# Patient Record
Sex: Female | Born: 1975
Health system: Southern US, Community
[De-identification: ages and names within clinical notes are randomized; demographics above are authoritative.]

## PROBLEM LIST (undated history)

## (undated) DIAGNOSIS — E669 Obesity, unspecified: Secondary | ICD-10-CM

## (undated) DIAGNOSIS — R12 Heartburn: Secondary | ICD-10-CM

## (undated) HISTORY — DX: Obesity, unspecified: E66.9

## (undated) HISTORY — DX: Heartburn: R12

---

## 2009-01-17 ENCOUNTER — Inpatient Hospital Stay: Payer: Self-pay | Admitting: Internal Medicine

## 2009-04-29 ENCOUNTER — Ambulatory Visit: Payer: Self-pay | Admitting: Cardiology

## 2009-05-10 ENCOUNTER — Emergency Department: Payer: Self-pay | Admitting: Emergency Medicine

## 2009-05-12 ENCOUNTER — Emergency Department: Payer: Self-pay | Admitting: Emergency Medicine

## 2009-05-13 ENCOUNTER — Emergency Department: Payer: Self-pay | Admitting: Emergency Medicine

## 2009-05-14 ENCOUNTER — Emergency Department: Payer: Self-pay | Admitting: Emergency Medicine

## 2014-06-10 DIAGNOSIS — R12 Heartburn: Secondary | ICD-10-CM

## 2014-06-10 DIAGNOSIS — E669 Obesity, unspecified: Secondary | ICD-10-CM | POA: Insufficient documentation

## 2014-07-18 ENCOUNTER — Ambulatory Visit (INDEPENDENT_AMBULATORY_CARE_PROVIDER_SITE_OTHER): Payer: BLUE CROSS/BLUE SHIELD

## 2014-07-18 VITALS — BP 136/81 | HR 89 | Ht 71.0 in | Wt 178.4 lb

## 2014-07-18 DIAGNOSIS — E669 Obesity, unspecified: Secondary | ICD-10-CM | POA: Diagnosis not present

## 2014-07-18 MED ORDER — PHENTERMINE HCL 37.5 MG PO CAPS: 37.5000 mg | ORAL_CAPSULE | ORAL | Status: DC

## 2014-07-18 NOTE — Progress Notes (Signed)
Patient ID: Brenda Walsh, female   DOB: 05/18/1975, 39 y.o.   MRN: [id number] Who presents for weight, B/P ck., and B12 injection. Needs a refill on Phentermine. B12 not needed at this time.  Pt. States she has no side effects at this time. Would like to wait on B12 injection.  Has an appt. In July for annual with MNB.

## 2014-08-20 ENCOUNTER — Ambulatory Visit (INDEPENDENT_AMBULATORY_CARE_PROVIDER_SITE_OTHER): Payer: BLUE CROSS/BLUE SHIELD | Admitting: Obstetrics and Gynecology

## 2014-08-20 ENCOUNTER — Encounter: Payer: Self-pay | Admitting: Obstetrics and Gynecology

## 2014-08-20 ENCOUNTER — Other Ambulatory Visit: Payer: Self-pay | Admitting: Obstetrics and Gynecology

## 2014-08-20 ENCOUNTER — Other Ambulatory Visit: Payer: Self-pay | Admitting: *Deleted

## 2014-08-20 VITALS — BP 121/90 | HR 84 | Ht 70.0 in | Wt 178.7 lb

## 2014-08-20 DIAGNOSIS — Z01419 Encounter for gynecological examination (general) (routine) without abnormal findings: Secondary | ICD-10-CM | POA: Diagnosis not present

## 2014-08-20 MED ORDER — ETONOGESTREL-ETHINYL ESTRADIOL 0.12-0.015 MG/24HR VA RING: 1.0000 | VAGINAL_RING | VAGINAL | Status: DC

## 2014-08-20 NOTE — Progress Notes (Signed)
  Subjective:     Brenda Walsh is a 39 y.o. female and is here for a comprehensive physical exam. The patient reports no problems.  History   Social History  . Marital Status: Married    Spouse Name: N/A  . Number of Children: N/A  . Years of Education: N/A   Occupational History  . Not on file.   Social History Main Topics  . Smoking status: Never Smoker   . Smokeless tobacco: Never Used  . Alcohol Use: Yes     Comment: OCCASIONAL  . Drug Use: No  . Sexual Activity: Yes    Birth Control/ Protection: Inserts   Other Topics Concern  . Not on file   Social History Narrative   Health Maintenance  Topic Date Due  . HIV Screening  03/25/1989  . PAP SMEAR  03/25/1992  . TETANUS/TDAP  03/25/1993  . INFLUENZA VACCINE  09/15/2014    The following portions of the patient's history were reviewed and updated as appropriate: allergies, current medications, past family history, past medical history, past social history, past surgical history and problem list.  Review of Systems A comprehensive review of systems was negative.   Objective:    General appearance: alert, cooperative and appears stated age Neck: no adenopathy, no carotid bruit, no JVD, supple, symmetrical, trachea midline and thyroid not enlarged, symmetric, no tenderness/mass/nodules Lungs: clear to auscultation bilaterally Breasts: normal appearance, no masses or tenderness Heart: regular rate and rhythm, S1, S2 normal, no murmur, click, rub or gallop Abdomen: soft, non-tender; bowel sounds normal; no masses,  no organomegaly Pelvic: cervix normal in appearance, external genitalia normal, no adnexal masses or tenderness, no cervical motion tenderness, rectovaginal septum normal, uterus normal size, shape, and consistency and vagina normal without discharge Extremities: extremities normal, atraumatic, no cyanosis or edema    Assessment:    Healthy female exam. HC user, overweight      Plan:  Routine screening  labs and refills on all meeds; MMG ordered;  RTC 1 year for AE and in 4 weeks for weight management   See After Visit Summary for Counseling Recommendations  Loralie Malta Ines BloomerBurr, CNM

## 2014-08-20 NOTE — Patient Instructions (Signed)

## 2014-08-21 LAB — COMPREHENSIVE METABOLIC PANEL
ALBUMIN: 4 g/dL (ref 3.5–5.5)
ALT: 12 IU/L (ref 0–32)
AST: 16 IU/L (ref 0–40)
Albumin/Globulin Ratio: 1.4 (ref 1.1–2.5)
Alkaline Phosphatase: 56 IU/L (ref 39–117)
BUN/Creatinine Ratio: 14 (ref 9–23)
BUN: 11 mg/dL (ref 6–24)
Bilirubin Total: 0.2 mg/dL (ref 0.0–1.2)
CALCIUM: 9.5 mg/dL (ref 8.7–10.2)
CO2: 21 mmol/L (ref 18–29)
Chloride: 100 mmol/L (ref 97–108)
Creatinine, Ser: 0.81 mg/dL (ref 0.57–1.00)
GFR calc Af Amer: 105 mL/min/{1.73_m2} (ref >59)
GFR calc non Af Amer: 91 mL/min/{1.73_m2} (ref >59)
GLOBULIN, TOTAL: 2.9 g/dL (ref 1.5–4.5)
GLUCOSE: 92 mg/dL (ref 65–99)
POTASSIUM: 4.4 mmol/L (ref 3.5–5.2)
Sodium: 138 mmol/L (ref 134–144)
Total Protein: 6.9 g/dL (ref 6.0–8.5)

## 2014-08-21 LAB — LIPID PANEL
Chol/HDL Ratio: 2.5 ratio units (ref 0.0–4.4)
Cholesterol, Total: 222 mg/dL — ABNORMAL HIGH (ref 100–199)
HDL: 90 mg/dL (ref >39)
LDL Calculated: 113 mg/dL — ABNORMAL HIGH (ref 0–99)
TRIGLYCERIDES: 95 mg/dL (ref 0–149)
VLDL Cholesterol Cal: 19 mg/dL (ref 5–40)

## 2014-08-21 LAB — TSH: TSH: 4.2 u[IU]/mL (ref 0.450–4.500)

## 2014-08-21 LAB — VITAMIN D 25 HYDROXY (VIT D DEFICIENCY, FRACTURES): Vit D, 25-Hydroxy: 41.2 ng/mL (ref 30.0–100.0)

## 2014-08-24 LAB — PAP IG, CT-NG, RFX HPV ASCU: PAP Smear Comment: 0

## 2014-08-26 ENCOUNTER — Encounter: Payer: Self-pay | Admitting: *Deleted

## 2014-08-27 ENCOUNTER — Ambulatory Visit
Admission: RE | Admit: 2014-08-27 | Discharge: 2014-08-27 | Disposition: A | Discharge status: Home / Self Care | Payer: BLUE CROSS/BLUE SHIELD | Source: Ambulatory Visit | Attending: Obstetrics and Gynecology | Admitting: Obstetrics and Gynecology

## 2014-08-27 ENCOUNTER — Encounter: Payer: Self-pay | Admitting: *Deleted

## 2014-08-27 DIAGNOSIS — Z1231 Encounter for screening mammogram for malignant neoplasm of breast: Secondary | ICD-10-CM | POA: Insufficient documentation

## 2014-08-27 DIAGNOSIS — Z01419 Encounter for gynecological examination (general) (routine) without abnormal findings: Secondary | ICD-10-CM | POA: Diagnosis present

## 2014-11-03 ENCOUNTER — Encounter: Payer: Self-pay | Admitting: Obstetrics and Gynecology

## 2014-11-03 ENCOUNTER — Ambulatory Visit (INDEPENDENT_AMBULATORY_CARE_PROVIDER_SITE_OTHER): Payer: BLUE CROSS/BLUE SHIELD | Admitting: Obstetrics and Gynecology

## 2014-11-03 VITALS — BP 126/84 | HR 87 | Ht 70.0 in | Wt 180.4 lb

## 2014-11-03 DIAGNOSIS — E669 Obesity, unspecified: Secondary | ICD-10-CM | POA: Diagnosis not present

## 2014-11-03 MED ORDER — CLOBETASOL PROPIONATE 0.05 % EX OINT: 1.0000 "application " | TOPICAL_OINTMENT | Freq: Two times a day (BID) | CUTANEOUS | Status: DC

## 2014-11-03 MED ORDER — PHENTERMINE HCL 37.5 MG PO TABS: 37.5000 mg | ORAL_TABLET | Freq: Every day | ORAL | Status: DC

## 2014-11-03 MED ORDER — CYANOCOBALAMIN 1000 MCG/ML IJ SOLN: 1000.0000 ug | Freq: Once | INTRAMUSCULAR | Status: DC

## 2014-11-03 NOTE — Progress Notes (Signed)
Subjective:     Patient ID: Brenda Walsh, female   DOB: [birth year], 39 y.o.   MRN: [id number]  HPI Here for medication refill for weight management- has been off medication x 1 month, exercising 3 days a week for an hour and drinks a lot of water, feels like lack of weight loss may be more related too calorie intake and wine(weekends)  Also reports left vulvar itching intermittently x 3 months, worse at night  Review of Systems Vulvar itching    Objective:   Physical Exam A&O x4 Well groomed female in no distress Pelvic exam: VULVA: normal appearing vulva with no masses, tenderness or lesions, vulvar lesion on inner left minora at introitus c/w lichens-white plaque with inflamation around the area.    Assessment:     Overweight- on plan here- needs refill Lichens of left vulva     Plan:     Restart weight loss medications, B12 injection given today, will return in 5 weeks for weight check and next B12 injection. To increase exercise to 4-5 days a week and limit alcohol and carb intake  Counseled on Lichens and treatment- RX sent in for Clobetasol oint- to apply nightly x3 weeks then qohs until next appointment- will recheck in 5 weeks.  Melody Ines Bloomer, CNM

## 2014-11-03 NOTE — Patient Instructions (Signed)
Lichen Sclerosus Lichen sclerosus is a skin problem. It can happen on any part of the body, but it commonly involves the anal or genital areas. Lichen sclerosus is not an infection or a fungus. Girls and women are more commonly affected than boys and men. CAUSES The cause is not known. It could be the result of an overactive immune system or a lack of certain hormones. Lichen sclerosus is not passed from one person to another (not contagious). SYMPTOMS Your skin may have:  Thin, wrinkled, white areas.  Thickened white areas.  Red and swollen patches.  Tears or cracks.  Bruising.  Blood blisters.  Severe itching. You may also have pain, itching, or burning with urination. Constipation is also common in people with lichen sclerosus. DIAGNOSIS Your caregiver will do a physical exam. Sometimes, a tissue sample (biopsy) may be sent for testing. TREATMENT Treatment may involve putting a thin layer of medicated cream (topical steroid) over the areas with lichen sclerosus. Use the cream only as directed by your caregiver.  HOME CARE INSTRUCTIONS  Only take over-the-counter or prescription medicines as directed by your caregiver.  Keep the vaginal area as clean and dry as possible. SEEK MEDICAL CARE IF: You develop increasing pain, swelling, or redness. Document Released: 06/23/2010 Document Revised: 04/25/2011 Document Reviewed: 06/23/2010 ExitCare Patient Information 2015 [employer]. This information is not intended to replace advice given to you by your health care provider. Make sure you discuss any questions you have with your health care provider.  

## 2014-11-07 ENCOUNTER — Ambulatory Visit: Payer: BLUE CROSS/BLUE SHIELD | Admitting: Obstetrics and Gynecology

## 2014-12-09 ENCOUNTER — Telehealth: Payer: Self-pay | Admitting: Obstetrics and Gynecology

## 2014-12-09 NOTE — Telephone Encounter (Signed)
PT HAD AN APPT TOMORROW AND CANCELED IT SHE WANTED ME TO LET YOU KNOW THAT THE PROBLEM WAS 100% CLEARED UP.

## 2014-12-10 ENCOUNTER — Ambulatory Visit: Payer: BLUE CROSS/BLUE SHIELD | Admitting: Obstetrics and Gynecology

## 2015-02-19 ENCOUNTER — Telehealth: Payer: Self-pay | Admitting: Obstetrics and Gynecology

## 2015-02-19 NOTE — Telephone Encounter (Signed)
Yes, will need to be seen.

## 2015-02-19 NOTE — Telephone Encounter (Signed)
Patient called requesting a refill [facility] [place]. Thanks

## 2015-02-19 NOTE — Telephone Encounter (Signed)
Pt needs to be seen correct?

## 2015-02-23 NOTE — Telephone Encounter (Signed)
Notified pt she will need to be seen

## 2015-03-04 ENCOUNTER — Ambulatory Visit: Payer: BLUE CROSS/BLUE SHIELD | Admitting: Obstetrics and Gynecology

## 2015-03-10 ENCOUNTER — Ambulatory Visit (INDEPENDENT_AMBULATORY_CARE_PROVIDER_SITE_OTHER): Payer: BLUE CROSS/BLUE SHIELD | Admitting: Obstetrics and Gynecology

## 2015-03-10 ENCOUNTER — Encounter: Payer: Self-pay | Admitting: Obstetrics and Gynecology

## 2015-03-10 VITALS — BP 94/67 | HR 70 | Ht 70.0 in | Wt 183.9 lb

## 2015-03-10 DIAGNOSIS — E663 Overweight: Secondary | ICD-10-CM

## 2015-03-10 MED ORDER — CYANOCOBALAMIN 1000 MCG/ML IJ SOLN: 1000.0000 ug | Freq: Once | INTRAMUSCULAR | Status: DC

## 2015-03-10 MED ORDER — PHENTERMINE HCL 37.5 MG PO TABS: 37.5000 mg | ORAL_TABLET | Freq: Every day | ORAL | Status: DC

## 2015-03-10 NOTE — Progress Notes (Signed)
SUBJECTIVE:  40 y.o. here for follow-up weight loss visit, previously seen 4 weeks ago. Denies any concerns and feels like medication worked well, has been out of medication x 5 weeks, desires restarting. Did get B12 last week.  OBJECTIVE:  BP 94/67 mmHg  Pulse 70  Ht  (1.778 m)  Wt 183 lb 14.4 oz (83.416 kg)  BMI 26.39 kg/m2  LMP 02/25/2015  Body mass index is 26.39 kg/(m^2). Patient appears well. ASSESSMENT:  Obesity- responding well to weight loss plan PLAN:  To continue with current medications- prescriptions given.  RTC in 4 weeks as planned  Melody Reader, CNM

## 2015-03-11 ENCOUNTER — Ambulatory Visit: Payer: BLUE CROSS/BLUE SHIELD | Admitting: Obstetrics and Gynecology

## 2015-04-08 ENCOUNTER — Ambulatory Visit: Payer: BLUE CROSS/BLUE SHIELD

## 2015-06-22 ENCOUNTER — Telehealth: Payer: Self-pay | Admitting: Obstetrics and Gynecology

## 2015-06-22 NOTE — Telephone Encounter (Signed)
pls advise

## 2015-06-22 NOTE — Telephone Encounter (Signed)
This pt said she was out of phentermine and don't have an appt with Melody for wt mngt for a while, can she have a refill

## 2015-06-23 NOTE — Telephone Encounter (Signed)
Needs to wait on appt

## 2015-06-29 NOTE — Telephone Encounter (Signed)
Pt has appt coming up.

## 2015-07-06 ENCOUNTER — Ambulatory Visit (INDEPENDENT_AMBULATORY_CARE_PROVIDER_SITE_OTHER): Payer: BLUE CROSS/BLUE SHIELD | Admitting: Obstetrics and Gynecology

## 2015-07-06 ENCOUNTER — Encounter: Payer: Self-pay | Admitting: Obstetrics and Gynecology

## 2015-07-06 VITALS — BP 121/85 | HR 99 | Ht 70.0 in | Wt 183.8 lb

## 2015-07-06 DIAGNOSIS — E669 Obesity, unspecified: Secondary | ICD-10-CM

## 2015-07-06 MED ORDER — CYANOCOBALAMIN 1000 MCG/ML IJ SOLN: 1000.0000 ug | Freq: Once | INTRAMUSCULAR | Status: DC

## 2015-07-06 MED ORDER — CYANOCOBALAMIN 1000 MCG/ML IJ SOLN
1000.0000 ug | Freq: Once | INTRAMUSCULAR | Status: AC
  Administered 2015-07-06: 1000 ug via INTRAMUSCULAR

## 2015-07-06 NOTE — Progress Notes (Signed)
SUBJECTIVE:  40 y.o. here for follow-up weight loss visit, previously seen 4 weeks ago. Denies any concerns and feels like medication works well, doesn't take it every day but would like to do another round of it.  OBJECTIVE:  BP 121/85 mmHg  Pulse 99  Ht 5\' 10"  (1.778 m)  Wt 183 lb 12.8 oz (83.371 kg)  BMI 26.37 kg/m2  LMP 07/01/2015  Body mass index is 26.37 kg/(m^2). Patient appears well. ASSESSMENT:  Overweight-medication management- responding well to weight loss plan PLAN:  To continue with current medications. B12 103300mcg/ml injection given; also recommend 10 day green smoothie cleanse. RTC in 12 weeks as planned  Isair Inabinet Abbs ValleyShambley, [place]

## 2015-07-28 ENCOUNTER — Other Ambulatory Visit: Payer: Self-pay

## 2015-07-28 MED ORDER — ETONOGESTREL-ETHINYL ESTRADIOL 0.12-0.015 MG/24HR VA RING: 1.0000 | VAGINAL_RING | VAGINAL | Status: DC

## 2015-10-27 ENCOUNTER — Telehealth: Payer: Self-pay | Admitting: Obstetrics and Gynecology

## 2015-10-27 NOTE — Telephone Encounter (Signed)
Pt has appt 9/27. She wants refill of phenetermine sent to [facility] [place]

## 2015-10-28 NOTE — Telephone Encounter (Signed)
Pt needs appt for refill  

## 2015-11-11 ENCOUNTER — Ambulatory Visit (INDEPENDENT_AMBULATORY_CARE_PROVIDER_SITE_OTHER): Payer: BLUE CROSS/BLUE SHIELD | Admitting: Obstetrics and Gynecology

## 2015-11-11 ENCOUNTER — Encounter: Payer: Self-pay | Admitting: Obstetrics and Gynecology

## 2015-11-11 VITALS — BP 121/84 | HR 76 | Ht 70.0 in | Wt 187.3 lb

## 2015-11-11 DIAGNOSIS — Z0000 Encounter for general adult medical examination without abnormal findings: Secondary | ICD-10-CM

## 2015-11-11 MED ORDER — ETONOGESTREL-ETHINYL ESTRADIOL 0.12-0.015 MG/24HR VA RING: 1.0000 | VAGINAL_RING | VAGINAL | 1 refills | Status: DC

## 2015-11-11 MED ORDER — CYANOCOBALAMIN 1000 MCG/ML IJ SOLN: 1000.0000 ug | Freq: Once | INTRAMUSCULAR | 1 refills | Status: AC

## 2015-11-11 MED ORDER — PHENTERMINE HCL 37.5 MG PO TABS: 37.5000 mg | ORAL_TABLET | Freq: Every day | ORAL | 2 refills | Status: DC

## 2015-11-11 NOTE — Progress Notes (Signed)
SUBJECTIVE:  40 y.o. here for follow-up weight loss visit, previously seen 4 weeks ago. Denies any concerns and feels like medication works well. Has been off for one month. Desires restart. Also needs refill on Nuvaring and to set up AE.  OBJECTIVE:  BP 121/84   Pulse 76   Ht 5\' 10"  (1.778 m)   Wt 187 lb 4.8 oz (85 kg)   LMP 10/12/2015   BMI 26.87 kg/m   Body mass index is 26.87 kg/m. Patient appears well. ASSESSMENT:  Overweight- responding well to weight loss plan PLAN:  To continue with current medications. B12 102800mcg/ml injection given RTC in 4 weeks as planned  Brenda Walsh, CNM

## 2015-11-23 ENCOUNTER — Ambulatory Visit
Admission: RE | Admit: 2015-11-23 | Discharge: 2015-11-23 | Disposition: A | Discharge status: Home / Self Care | Payer: BLUE CROSS/BLUE SHIELD | Source: Ambulatory Visit | Attending: Obstetrics and Gynecology | Admitting: Obstetrics and Gynecology

## 2015-11-23 ENCOUNTER — Other Ambulatory Visit: Payer: Self-pay | Admitting: Obstetrics and Gynecology

## 2015-11-23 DIAGNOSIS — Z1231 Encounter for screening mammogram for malignant neoplasm of breast: Secondary | ICD-10-CM | POA: Diagnosis present

## 2015-11-23 DIAGNOSIS — Z0000 Encounter for general adult medical examination without abnormal findings: Secondary | ICD-10-CM

## 2016-01-08 ENCOUNTER — Other Ambulatory Visit: Payer: Self-pay | Admitting: Obstetrics and Gynecology

## 2016-02-23 ENCOUNTER — Encounter: Payer: Self-pay | Admitting: Obstetrics and Gynecology

## 2016-02-24 ENCOUNTER — Encounter: Payer: BLUE CROSS/BLUE SHIELD | Admitting: Obstetrics and Gynecology

## 2016-03-17 ENCOUNTER — Ambulatory Visit (INDEPENDENT_AMBULATORY_CARE_PROVIDER_SITE_OTHER): Payer: BLUE CROSS/BLUE SHIELD | Admitting: Obstetrics and Gynecology

## 2016-03-17 ENCOUNTER — Encounter: Payer: Self-pay | Admitting: Obstetrics and Gynecology

## 2016-03-17 VITALS — BP 129/84 | HR 81 | Ht 70.0 in | Wt 187.7 lb

## 2016-03-17 DIAGNOSIS — Z01419 Encounter for gynecological examination (general) (routine) without abnormal findings: Secondary | ICD-10-CM

## 2016-03-17 MED ORDER — CYANOCOBALAMIN 1000 MCG/ML IJ SOLN: 1000.0000 ug | INTRAMUSCULAR | 1 refills | Status: DC

## 2016-03-17 MED ORDER — PHENTERMINE HCL 37.5 MG PO TABS: 37.5000 mg | ORAL_TABLET | Freq: Every day | ORAL | 2 refills | Status: DC

## 2016-03-17 MED ORDER — ETONOGESTREL-ETHINYL ESTRADIOL 0.12-0.015 MG/24HR VA RING: 1.0000 | VAGINAL_RING | VAGINAL | 11 refills | Status: DC

## 2016-03-17 NOTE — Progress Notes (Signed)
Subjective:   Brenda Walsh is a 41 y.o. 231P1000 Caucasian female here for a routine well-woman exam.  Patient's last menstrual period was 02/24/2016.    Current complaints: none PCP: me       Does need labs  Social History: Sexual: heterosexual Marital Status: married Living situation: with family Occupation: hairdresser Tobacco/alcohol: no tobacco use Illicit drugs: no history of illicit drug use  The following portions of the patient's history were reviewed and updated as appropriate: allergies, current medications, past family history, past medical history, past social history, past surgical history and problem list.  Past Medical History Past Medical History:  Diagnosis Date  . Heart burn   . Obesity     Past Surgical History History reviewed. No pertinent surgical history.  Gynecologic History G1P1000  Patient's last menstrual period was 02/24/2016. Contraception: NuvaRing vaginal inserts Last Pap: 206. Results were: normal Last mammogram: 2017. Results were: normal   Obstetric History OB History  Gravida Para Term Preterm AB Living  1 1 1         SAB TAB Ectopic Multiple Live Births               # Outcome Date GA Lbr Len/2nd Weight Sex Delivery Anes PTL Lv  1 Term               Current Medications Current Outpatient Prescriptions on File Prior to Visit  Medication Sig Dispense Refill  . NUVARING 0.12-0.015 MG/24HR vaginal ring INSERT 1 RING VAGINALLY EVERY 28 DAYS. LEAVE IN FOR 3 WEEKS THEN REMOVE FOR 1 WEEK. 1 each 2  . clobetasol ointment (TEMOVATE) 0.05 % Apply 1 application topically 2 (two) times daily. Apply to affected area (Patient not taking: Reported on 11/11/2015) 30 g 2  . phentermine (ADIPEX-P) 37.5 MG tablet Take 1 tablet (37.5 mg total) by mouth daily before breakfast. (Patient not taking: Reported on 03/17/2016) 30 tablet 2   No current facility-administered medications on file prior to visit.     Review of Systems Patient denies any  headaches, blurred vision, shortness of breath, chest pain, abdominal pain, problems with bowel movements, urination, or intercourse.  Objective:  BP 129/84   Pulse 81   Ht 5\' 10"  (1.778 m)   Wt 187 lb 11.2 oz (85.1 kg)   LMP 02/24/2016   BMI 26.93 kg/m  Physical Exam  General:  Well developed, well nourished, no acute distress. She is alert and oriented x3. Skin:  Warm and dry Neck:  Midline trachea, no thyromegaly or nodules Cardiovascular: Regular rate and rhythm, no murmur heard Lungs:  Effort normal, all lung fields clear to auscultation bilaterally Breasts:  No dominant palpable mass, retraction, or nipple discharge Abdomen:  Soft, non tender, no hepatosplenomegaly or masses Pelvic:  External genitalia is normal in appearance.  The vagina is normal in appearance. The cervix is bulbous, no CMT.  Thin prep pap is not done . Uterus is felt to be normal size, shape, and contour.  No adnexal masses or tenderness noted. Extremities:  No swelling or varicosities noted Psych:  She has a normal mood and affect  Assessment:   Healthy well-woman exam  Plan:  meds refilled F/U 1 year for AE, or sooner if needed   Lynnann Knudsen Suzan NailerN Tiffannie Sloss, CNM

## 2016-03-17 NOTE — Patient Instructions (Signed)
 Preventive Care 18-39 Years, Female Preventive care refers to lifestyle choices and visits with your health care provider that can promote health and wellness. What does preventive care include?  A yearly physical exam. This is also called an annual well check.  Dental exams once or twice a year.  Routine eye exams. Ask your health care provider how often you should have your eyes checked.  Personal lifestyle choices, including:  Daily care of your teeth and gums.  Regular physical activity.  Eating a healthy diet.  Avoiding tobacco and drug use.  Limiting alcohol use.  Practicing safe sex.  Taking vitamin and mineral supplements as recommended by your health care provider. What happens during an annual well check? The services and screenings done by your health care provider during your annual well check will depend on your age, overall health, lifestyle risk factors, and family history of disease. Counseling  Your health care provider may ask you questions about your:  Alcohol use.  Tobacco use.  Drug use.  Emotional well-being.  Home and relationship well-being.  Sexual activity.  Eating habits.  Work and work environment.  Method of birth control.  Menstrual cycle.  Pregnancy history. Screening  You may have the following tests or measurements:  Height, weight, and BMI.  Diabetes screening. This is done by checking your blood sugar (glucose) after you have not eaten for a while (fasting).  Blood pressure.  Lipid and cholesterol levels. These may be checked every 5 years starting at age 20.  Skin check.  Hepatitis C blood test.  Hepatitis B blood test.  Sexually transmitted disease (STD) testing.  BRCA-related cancer screening. This may be done if you have a family history of breast, ovarian, tubal, or peritoneal cancers.  Pelvic exam and Pap test. This may be done every 3 years starting at age 21. Starting at age 30, this may be done  every 5 years if you have a Pap test in combination with an HPV test. Discuss your test results, treatment options, and if necessary, the need for more tests with your health care provider. Vaccines  Your health care provider may recommend certain vaccines, such as:  Influenza vaccine. This is recommended every year.  Tetanus, diphtheria, and acellular pertussis (Tdap, Td) vaccine. You may need a Td booster every 10 years.  Varicella vaccine. You may need this if you have not been vaccinated.  HPV vaccine. If you are 26 or younger, you may need three doses over 6 months.  Measles, mumps, and rubella (MMR) vaccine. You may need at least one dose of MMR. You may also need a second dose.  Pneumococcal 13-valent conjugate (PCV13) vaccine. You may need this if you have certain conditions and were not previously vaccinated.  Pneumococcal polysaccharide (PPSV23) vaccine. You may need one or two doses if you smoke cigarettes or if you have certain conditions.  Meningococcal vaccine. One dose is recommended if you are age 19-21 years and a first-year college student living in a residence hall, or if you have one of several medical conditions. You may also need additional booster doses.  Hepatitis A vaccine. You may need this if you have certain conditions or if you travel or work in places where you may be exposed to hepatitis A.  Hepatitis B vaccine. You may need this if you have certain conditions or if you travel or work in places where you may be exposed to hepatitis B.  Haemophilus influenzae type b (Hib) vaccine. You may need   this if you have certain risk factors. Talk to your health care provider about which screenings and vaccines you need and how often you need them. This information is not intended to replace advice given to you by your health care provider. Make sure you discuss any questions you have with your health care provider. Document Released: 03/29/2001 Document Revised:  10/21/2015 Document Reviewed: 12/02/2014 Elsevier Interactive Patient Education  2017 Elsevier Inc.  

## 2016-03-18 LAB — COMPREHENSIVE METABOLIC PANEL
ALT: 11 IU/L (ref 0–32)
AST: 14 IU/L (ref 0–40)
Albumin/Globulin Ratio: 1.2 (ref 1.2–2.2)
Albumin: 3.8 g/dL (ref 3.5–5.5)
Alkaline Phosphatase: 59 IU/L (ref 39–117)
BUN/Creatinine Ratio: 18 (ref 9–23)
BUN: 16 mg/dL (ref 6–24)
Bilirubin Total: 0.2 mg/dL (ref 0.0–1.2)
CHLORIDE: 101 mmol/L (ref 96–106)
CO2: 23 mmol/L (ref 18–29)
Calcium: 9.1 mg/dL (ref 8.7–10.2)
Creatinine, Ser: 0.89 mg/dL (ref 0.57–1.00)
GFR calc non Af Amer: 81 mL/min/{1.73_m2} (ref >59)
GFR, EST AFRICAN AMERICAN: 93 mL/min/{1.73_m2} (ref >59)
GLUCOSE: 87 mg/dL (ref 65–99)
Globulin, Total: 3.3 g/dL (ref 1.5–4.5)
Potassium: 3.6 mmol/L (ref 3.5–5.2)
Sodium: 138 mmol/L (ref 134–144)
TOTAL PROTEIN: 7.1 g/dL (ref 6.0–8.5)

## 2016-03-18 LAB — LIPID PANEL
Chol/HDL Ratio: 2.6 ratio units (ref 0.0–4.4)
Cholesterol, Total: 200 mg/dL — ABNORMAL HIGH (ref 100–199)
HDL: 78 mg/dL (ref >39)
LDL Calculated: 109 mg/dL — ABNORMAL HIGH (ref 0–99)
Triglycerides: 67 mg/dL (ref 0–149)
VLDL Cholesterol Cal: 13 mg/dL (ref 5–40)

## 2016-03-18 LAB — THYROID PANEL WITH TSH
Free Thyroxine Index: 1.8 (ref 1.2–4.9)
T3 UPTAKE RATIO: 20 % — AB (ref 24–39)
T4 TOTAL: 9.2 ug/dL (ref 4.5–12.0)
TSH: 3.58 u[IU]/mL (ref 0.450–4.500)

## 2016-03-18 LAB — VITAMIN D 25 HYDROXY (VIT D DEFICIENCY, FRACTURES): Vit D, 25-Hydroxy: 32.9 ng/mL (ref 30.0–100.0)

## 2016-03-21 ENCOUNTER — Telehealth: Payer: Self-pay | Admitting: *Deleted

## 2016-03-21 NOTE — Telephone Encounter (Signed)
Mailed info to pt 

## 2016-03-21 NOTE — Telephone Encounter (Signed)
-----   Message from Purcell NailsMelody N Shambley, [place] sent at 03/18/2016  4:33 PM EST ----- Labs look good except cholesterol, but it is better than last year

## 2016-06-29 ENCOUNTER — Encounter: Payer: Self-pay | Admitting: Obstetrics and Gynecology

## 2016-06-29 ENCOUNTER — Ambulatory Visit (INDEPENDENT_AMBULATORY_CARE_PROVIDER_SITE_OTHER): Payer: BLUE CROSS/BLUE SHIELD | Admitting: Obstetrics and Gynecology

## 2016-06-29 VITALS — BP 122/90 | HR 93 | Ht 70.0 in | Wt 184.9 lb

## 2016-06-29 DIAGNOSIS — Z79899 Other long term (current) drug therapy: Secondary | ICD-10-CM

## 2016-06-29 DIAGNOSIS — E663 Overweight: Secondary | ICD-10-CM

## 2016-06-29 MED ORDER — PHENTERMINE HCL 37.5 MG PO TABS: 37.5000 mg | ORAL_TABLET | Freq: Every day | ORAL | 2 refills | Status: DC

## 2016-06-29 MED ORDER — CLOBETASOL PROPIONATE 0.05 % EX OINT: 1.0000 "application " | TOPICAL_OINTMENT | Freq: Two times a day (BID) | CUTANEOUS | 2 refills | Status: DC

## 2016-06-29 MED ORDER — SCOPOLAMINE 1 MG/3DAYS TD PT72: 1.0000 | MEDICATED_PATCH | TRANSDERMAL | 12 refills | Status: DC

## 2016-06-29 NOTE — Progress Notes (Signed)
SUBJECTIVE:  41 y.o. here for weight loss visit, previously seen 12 weeks ago. Also request nausea patch for when she is on the boat. Desires restart of weight loss medicine.  OBJECTIVE:  BP 122/90   Pulse 93   Ht 5\' 10"  (1.778 m)   Wt 184 lb 14.4 oz (83.9 kg)   LMP 06/29/2016   BMI 26.53 kg/m   Body mass index is 26.53 kg/m. Patient appears well. ASSESSMENT:  Overweight- responding well to weight loss plan PLAN:  Restart weight loss medications. B12 101000mcg/ml injection given, scopolamine patch sent in. RTC in 4 weeks as planned  Nitzia Perren [place]iant[place]Shambley, CNM

## 2016-10-05 ENCOUNTER — Encounter: Payer: Self-pay | Admitting: Obstetrics and Gynecology

## 2016-10-05 ENCOUNTER — Ambulatory Visit (INDEPENDENT_AMBULATORY_CARE_PROVIDER_SITE_OTHER): Payer: BLUE CROSS/BLUE SHIELD | Admitting: Obstetrics and Gynecology

## 2016-10-05 VITALS — BP 117/80 | HR 99 | Ht 70.0 in | Wt 179.1 lb

## 2016-10-05 DIAGNOSIS — E663 Overweight: Secondary | ICD-10-CM

## 2016-10-05 MED ORDER — PHENTERMINE HCL 37.5 MG PO TABS: 37.5000 mg | ORAL_TABLET | Freq: Every day | ORAL | 2 refills | Status: DC

## 2016-10-05 NOTE — Progress Notes (Signed)
SUBJECTIVE:  41 y.o. here for follow-up weight loss visit, previously seen 4 weeks ago. Denies any concerns but wants to continue on phentermine till BMI is <24.  OBJECTIVE:  BP 117/80   Pulse 99   Ht 5\' 10"  (1.778 m)   Wt 179 lb 1.6 oz (81.2 kg)   BMI 25.70 kg/m   Body mass index is 25.7 kg/m. Patient appears well. ASSESSMENT:  Overweight- responding well to weight loss plan PLAN:  To continue with current medications. B12 1082mcg/ml injection given RTC in 4 weeks as planned  Melody [place]vue, CNM

## 2016-10-12 ENCOUNTER — Encounter: Payer: BLUE CROSS/BLUE SHIELD | Admitting: Obstetrics and Gynecology

## 2017-01-13 ENCOUNTER — Encounter: Payer: Self-pay | Admitting: Obstetrics and Gynecology

## 2017-01-13 ENCOUNTER — Ambulatory Visit (INDEPENDENT_AMBULATORY_CARE_PROVIDER_SITE_OTHER): Payer: BLUE CROSS/BLUE SHIELD | Admitting: Obstetrics and Gynecology

## 2017-01-13 VITALS — BP 126/84 | HR 88 | Ht 70.0 in | Wt 184.2 lb

## 2017-01-13 DIAGNOSIS — E663 Overweight: Secondary | ICD-10-CM

## 2017-01-13 DIAGNOSIS — Z23 Encounter for immunization: Secondary | ICD-10-CM

## 2017-01-13 MED ORDER — PHENTERMINE HCL 37.5 MG PO TABS: 37.5000 mg | ORAL_TABLET | Freq: Every day | ORAL | 2 refills | Status: DC

## 2017-01-13 NOTE — Progress Notes (Signed)
SUBJECTIVE:  41 y.o. here for follow-up weight loss visit, previously seen 4 weeks ago. Denies any concerns and desires restarting the medications. Has been off for a few weeks. States it still works well for her when she takes it. Also needs a flu vaccine.  OBJECTIVE:  BP 126/84   Pulse 88   Ht 5\' 10"  (1.778 m)   Wt 184 lb 3.2 oz (83.6 kg)   BMI 26.43 kg/m   Body mass index is 26.43 kg/m. Patient appears well.  ASSESSMENT:  Overweight Needs flu vaccine  PLAN:  To continue with current medications. B12 10800mcg/ml injection given Flu vaccine given RTC in 12 weeks as planned  Melody SpringerShambley, CNM

## 2017-01-15 ENCOUNTER — Ambulatory Visit
Admission: EM | Admit: 2017-01-15 | Discharge: 2017-01-15 | Disposition: A | Discharge status: Home / Self Care | Payer: BLUE CROSS/BLUE SHIELD | Attending: Emergency Medicine | Admitting: Emergency Medicine

## 2017-01-15 ENCOUNTER — Other Ambulatory Visit: Payer: Self-pay

## 2017-01-15 DIAGNOSIS — J029 Acute pharyngitis, unspecified: Secondary | ICD-10-CM

## 2017-01-15 LAB — RAPID STREP SCREEN (MED CTR MEBANE ONLY): STREPTOCOCCUS, GROUP A SCREEN (DIRECT): NEGATIVE

## 2017-01-15 MED ORDER — IBUPROFEN 600 MG PO TABS: 600.0000 mg | ORAL_TABLET | Freq: Four times a day (QID) | ORAL | 0 refills | Status: DC | PRN

## 2017-01-15 NOTE — ED Provider Notes (Signed)
HPI  SUBJECTIVE:  Patient reports sore throat starting last night.  She has noted exudates on her right tonsil.  She had headaches for the past 2 days.  Reports body aches after getting a flu shot 2 days ago but states that this has mostly resolved.  She denies fevers, nasal congestion, postnasal drip, cough.  No drooling, trismus, voice changes.  No sensation of her throat swelling shut, difficulty breathing.  No rash, abdominal pain.  She tried ibuprofen 600 mg with improvement in her symptoms.  No aggravating factors.  No allergy or GERD type symptoms.  No antibiotics in the past month.  Son has similar symptoms and is being evaluated here today.  She took ibuprofen within 6-8 hours of evaluation.  Past medical history negative for diabetes, hypertension.  LMP: Last week.  Denies possibility being pregnant.  ZOX:[id number]:Shambley, Melody N, CNM .   Past Medical History:  Diagnosis Date  . Heart burn   . Obesity     History reviewed. No pertinent surgical history.  Family History  Problem Relation Age of Onset  . Breast cancer Neg Hx     Social History   Tobacco Use  . Smoking status: Never Smoker  . Smokeless tobacco: Never Used  Substance Use Topics  . Alcohol use: Yes    Comment: OCCASIONAL  . Drug use: No    No current facility-administered medications for this encounter.   Current Outpatient Medications:  .  clobetasol ointment (TEMOVATE) 0.05 %, Apply 1 application topically 2 (two) times daily. Apply to affected area, Disp: 30 g, Rfl: 2 .  cyanocobalamin (,VITAMIN B-12,) 1000 MCG/ML injection, Inject 1 mL (1,000 mcg total) into the muscle every 30 (thirty) days., Disp: 10 mL, Rfl: 1 .  etonogestrel-ethinyl estradiol (NUVARING) 0.12-0.015 MG/24HR vaginal ring, Place 1 each vaginally every 28 (twenty-eight) days. Insert vaginally and leave in place for 3 consecutive weeks, then remove for 1 week., Disp: 1 each, Rfl: 11 .  phentermine (ADIPEX-P) 37.5 MG tablet, Take 1 tablet  (37.5 mg total) by mouth daily before breakfast., Disp: 30 tablet, Rfl: 2 .  ibuprofen (ADVIL,MOTRIN) 600 MG tablet, Take 1 tablet (600 mg total) by mouth every 6 (six) hours as needed., Disp: 30 tablet, Rfl: 0  No Known Allergies   ROS  As noted in HPI.   Physical Exam  BP 122/83 (BP Location: Left Arm)   Pulse 85   Temp 98.3 F (36.8 C) (Oral)   Ht 5\' 10"  (1.778 m)   Wt 184 lb 3.2 oz (83.6 kg)   LMP 01/10/2017   SpO2 100%   BMI 26.43 kg/m   Constitutional: Well developed, well nourished, no acute distress Eyes:  EOMI, conjunctiva normal bilaterally HENT: Normocephalic, atraumatic,mucus membranes moist.  - nasal congestion +  erythematous oropharynx - enlarged tonsils + exudates right side. Uvula midline.  Respiratory: Normal inspiratory effort Cardiovascular: Normal rate, no murmurs, rubs, gallops GI: nondistended, nontender. No appreciable splenomegaly skin: No rash, skin intact Lymph: -  cervical LN  Musculoskeletal: no deformities Neurologic: Alert & oriented x 3, no focal neuro deficits Psychiatric: Speech and behavior appropriate..   ED Course   Medications - No data to display  Orders Placed This Encounter  Procedures  . Rapid strep screen    Standing Status:   Standing    Number of Occurrences:   1  . Culture, group A strep    Standing Status:   Standing    Number of Occurrences:   1  No results found for this or any previous visit (from the past 24 hour(s)). No results found.  ED Clinical Impression  Pharyngitis, unspecified etiology   ED Assessment/Plan  Rapid strep negative. Obtaining throat culture to guide antibiotic treatment. Discussed this with patient. We'll contact them if culture is positive, and will call in Appropriate antibiotics. Patient home with ibuprofen, Tylenol, Benadryl/Maalox mixture 3-4 times a day.  Patient to followup with PMD when necessary.  Discussed labs,  MDM, plan and followup with patient. Discussed sn/sx that  should prompt return to the ED. patient agrees with plan.   Meds ordered this encounter  Medications  . ibuprofen (ADVIL,MOTRIN) 600 MG tablet    Sig: Take 1 tablet (600 mg total) by mouth every 6 (six) hours as needed.    Dispense:  30 tablet    Refill:  0     *This clinic note was created using Scientist, clinical (histocompatibility and immunogenetics)Dragon dictation software. Therefore, there may be occasional mistakes despite careful proofreading.    Domenick GongMortenson, Dewie Ahart, MD 01/16/17 [phone]

## 2017-01-15 NOTE — ED Triage Notes (Signed)
Patient states she received the flu shot Friday and started having a sore throat yesterday.

## 2017-01-15 NOTE — Discharge Instructions (Signed)
your rapid strep was negative today, so we have sent off a throat culture.  We will contact you and call in the appropriate antibiotics if your culture comes back positive for an infection requiring antibiotic treatment.  Give us a working phone number.  If you were given a prescription for antibiotics, you may want to wait and fill it until you know the results of the culture.  1 gram of Tylenol and 600 mg ibuprofen together 3-4 times a day as needed for pain.  Make sure you drink plenty of extra fluids.  Some people find salt water gargles and  Traditional Medicinal's "Throat Coat" tea helpful. Take 5 mL of liquid Benadryl and 5 mL of Maalox. Mix it together, and then hold it in your mouth for as long as you can and then swallow. You may do this 4 times a day.   ° °Go to www.goodrx.com to look up your medications. This will give you a list of where you can find your prescriptions at the most affordable prices. Or ask the pharmacist what the cash price is, or if they have any other discount programs available to help make your medication more affordable. This can be less expensive than what you would pay with insurance.   °

## 2017-01-18 LAB — CULTURE, GROUP A STREP (THRC)

## 2017-01-20 ENCOUNTER — Telehealth: Payer: Self-pay | Admitting: *Deleted

## 2017-01-20 NOTE — Telephone Encounter (Signed)
Patient states she was seen at the Urgent care and she is still feeling bad. Patient thinks she has a sinus infection. Patient is wondering if Melody can call her in something for it. [facility] [place]. Please advise. Thank you

## 2017-02-22 ENCOUNTER — Other Ambulatory Visit: Payer: Self-pay | Admitting: Obstetrics and Gynecology

## 2017-02-22 DIAGNOSIS — Z1231 Encounter for screening mammogram for malignant neoplasm of breast: Secondary | ICD-10-CM

## 2017-03-08 ENCOUNTER — Ambulatory Visit
Admission: RE | Admit: 2017-03-08 | Discharge: 2017-03-08 | Disposition: A | Discharge status: Home / Self Care | Payer: BLUE CROSS/BLUE SHIELD | Source: Ambulatory Visit | Attending: Obstetrics and Gynecology | Admitting: Obstetrics and Gynecology

## 2017-03-08 DIAGNOSIS — Z1231 Encounter for screening mammogram for malignant neoplasm of breast: Secondary | ICD-10-CM | POA: Diagnosis present

## 2017-03-22 ENCOUNTER — Encounter: Payer: BLUE CROSS/BLUE SHIELD | Admitting: Obstetrics and Gynecology

## 2017-04-08 ENCOUNTER — Other Ambulatory Visit: Payer: Self-pay | Admitting: Obstetrics and Gynecology

## 2017-05-17 ENCOUNTER — Encounter: Payer: BLUE CROSS/BLUE SHIELD | Admitting: Obstetrics and Gynecology

## 2017-05-17 ENCOUNTER — Encounter: Payer: Self-pay | Admitting: Obstetrics and Gynecology

## 2017-05-17 ENCOUNTER — Ambulatory Visit (INDEPENDENT_AMBULATORY_CARE_PROVIDER_SITE_OTHER): Payer: BLUE CROSS/BLUE SHIELD | Admitting: Obstetrics and Gynecology

## 2017-05-17 VITALS — BP 143/96 | HR 101 | Ht 70.0 in | Wt 177.6 lb

## 2017-05-17 DIAGNOSIS — Z01419 Encounter for gynecological examination (general) (routine) without abnormal findings: Secondary | ICD-10-CM | POA: Diagnosis not present

## 2017-05-17 NOTE — Patient Instructions (Signed)
Preventive Care 18-39 Years, Female Preventive care refers to lifestyle choices and visits with your health care provider that can promote health and wellness. What does preventive care include?  A yearly physical exam. This is also called an annual well check.  Dental exams once or twice a year.  Routine eye exams. Ask your health care provider how often you should have your eyes checked.  Personal lifestyle choices, including: ? Daily care of your teeth and gums. ? Regular physical activity. ? Eating a healthy diet. ? Avoiding tobacco and drug use. ? Limiting alcohol use. ? Practicing safe sex. ? Taking vitamin and mineral supplements as recommended by your health care provider. What happens during an annual well check? The services and screenings done by your health care provider during your annual well check will depend on your age, overall health, lifestyle risk factors, and family history of disease. Counseling Your health care provider may ask you questions about your:  Alcohol use.  Tobacco use.  Drug use.  Emotional well-being.  Home and relationship well-being.  Sexual activity.  Eating habits.  Work and work Statistician.  Method of birth control.  Menstrual cycle.  Pregnancy history.  Screening You may have the following tests or measurements:  Height, weight, and BMI.  Diabetes screening. This is done by checking your blood sugar (glucose) after you have not eaten for a while (fasting).  Blood pressure.  Lipid and cholesterol levels. These may be checked every 5 years starting at age 38.  Skin check.  Hepatitis C blood test.  Hepatitis B blood test.  Sexually transmitted disease (STD) testing.  BRCA-related cancer screening. This may be done if you have a family history of breast, ovarian, tubal, or peritoneal cancers.  Pelvic exam and Pap test. This may be done every 3 years starting at age 38. Starting at age 30, this may be done  every 5 years if you have a Pap test in combination with an HPV test.  Discuss your test results, treatment options, and if necessary, the need for more tests with your health care provider. Vaccines Your health care provider may recommend certain vaccines, such as:  Influenza vaccine. This is recommended every year.  Tetanus, diphtheria, and acellular pertussis (Tdap, Td) vaccine. You may need a Td booster every 10 years.  Varicella vaccine. You may need this if you have not been vaccinated.  HPV vaccine. If you are 39 or younger, you may need three doses over 6 months.  Measles, mumps, and rubella (MMR) vaccine. You may need at least one dose of MMR. You may also need a second dose.  Pneumococcal 13-valent conjugate (PCV13) vaccine. You may need this if you have certain conditions and were not previously vaccinated.  Pneumococcal polysaccharide (PPSV23) vaccine. You may need one or two doses if you smoke cigarettes or if you have certain conditions.  Meningococcal vaccine. One dose is recommended if you are age 68-21 years and a first-year college student living in a residence hall, or if you have one of several medical conditions. You may also need additional booster doses.  Hepatitis A vaccine. You may need this if you have certain conditions or if you travel or work in places where you may be exposed to hepatitis A.  Hepatitis B vaccine. You may need this if you have certain conditions or if you travel or work in places where you may be exposed to hepatitis B.  Haemophilus influenzae type b (Hib) vaccine. You may need this  if you have certain risk factors.  Talk to your health care provider about which screenings and vaccines you need and how often you need them. This information is not intended to replace advice given to you by your health care provider. Make sure you discuss any questions you have with your health care provider. Document Released: 03/29/2001 Document Revised:  10/21/2015 Document Reviewed: 12/02/2014 Elsevier Interactive Patient Education  2018 Elsevier Inc[employer]  

## 2017-05-17 NOTE — Progress Notes (Signed)
Subjective:   Brenda Walsh is a 42 y.o. G19P1000 Caucasian female here for a routine well-woman exam.  Patient's last menstrual period was 04/15/2017.    Current complaints: none PCP: me       does desire labs  Social History: Sexual: heterosexual Marital Status: married Living situation: with family Occupation: self-employed Tobacco/alcohol: no tobacco use Illicit drugs: no history of illicit drug use  The following portions of the patient's history were reviewed and updated as appropriate: allergies, current medications, past family history, past medical history, past social history, past surgical history and problem list.  Past Medical History Past Medical History:  Diagnosis Date  . Heart burn   . Obesity     Past Surgical History History reviewed. No pertinent surgical history.  Gynecologic History G1P1000  Patient's last menstrual period was 04/15/2017. Contraception: NuvaRing vaginal inserts Last Pap: 2016. Results were: normal Last mammogram: 02/2017. Results were: normal   Obstetric History OB History  Gravida Para Term Preterm AB Living  1 1 1         SAB TAB Ectopic Multiple Live Births               # Outcome Date GA Lbr Len/2nd Weight Sex Delivery Anes PTL Lv  1 Term             Current Medications Current Outpatient Medications on File Prior to Visit  Medication Sig Dispense Refill  . NUVARING 0.12-0.015 MG/24HR vaginal ring PLACE 1 RING INTO VAGINA, LEAVE IN FOR 3 WEEKS, THEN REMOVE FOR 1 WEEK 1 each 9  . clobetasol ointment (TEMOVATE) 0.05 % Apply 1 application topically 2 (two) times daily. Apply to affected area (Patient not taking: Reported on 05/17/2017) 30 g 2  . cyanocobalamin (,VITAMIN B-12,) 1000 MCG/ML injection Inject 1 mL (1,000 mcg total) into the muscle every 30 (thirty) days. (Patient not taking: Reported on 05/17/2017) 10 mL 1  . ibuprofen (ADVIL,MOTRIN) 600 MG tablet Take 1 tablet (600 mg total) by mouth every 6 (six) hours as needed.  (Patient not taking: Reported on 05/17/2017) 30 tablet 0  . phentermine (ADIPEX-P) 37.5 MG tablet Take 1 tablet (37.5 mg total) by mouth daily before breakfast. (Patient not taking: Reported on 05/17/2017) 30 tablet 2   No current facility-administered medications on file prior to visit.     Review of Systems Patient denies any headaches, blurred vision, shortness of breath, chest pain, abdominal pain, problems with bowel movements, urination, or intercourse.  Objective:  BP (!) 143/96   Pulse (!) 101   Ht 5\' 10"  (1.778 m)   Wt 177 lb 9.6 oz (80.6 kg)   LMP 04/15/2017   BMI 25.48 kg/m  Physical Exam  General:  Well developed, well nourished, no acute distress. She is alert and oriented x3. Skin:  Warm and dry Neck:  Midline trachea, no thyromegaly or nodules Cardiovascular: Regular rate and rhythm, no murmur heard Lungs:  Effort normal, all lung fields clear to auscultation bilaterally Breasts:  No dominant palpable mass, retraction, or nipple discharge Abdomen:  Soft, non tender, no hepatosplenomegaly or masses Pelvic:  External genitalia is normal in appearance.  The vagina is normal in appearance. The cervix is bulbous, no CMT.  Thin prep pap is not done . Uterus is felt to be normal size, shape, and contour.  No adnexal masses or tenderness noted. Extremities:  No swelling or varicosities noted Psych:  She has a normal mood and affect  Assessment:   Healthy well-woman exam HC  user  Plan:  Labs obtained-will follow up accordingly. F/U 1 year for AE, or sooner if needed   Melody Suzan NailerN Shambley, CNM

## 2018-03-15 ENCOUNTER — Other Ambulatory Visit: Payer: Self-pay | Admitting: Obstetrics and Gynecology

## 2018-05-05 ENCOUNTER — Other Ambulatory Visit: Payer: Self-pay | Admitting: Obstetrics and Gynecology

## 2018-05-14 ENCOUNTER — Encounter: Payer: Self-pay | Admitting: *Deleted

## 2018-05-23 ENCOUNTER — Encounter: Payer: BLUE CROSS/BLUE SHIELD | Admitting: Obstetrics and Gynecology

## 2018-06-18 ENCOUNTER — Encounter: Payer: Self-pay | Admitting: Physician Assistant

## 2018-06-18 ENCOUNTER — Telehealth: Payer: BLUE CROSS/BLUE SHIELD | Admitting: Physician Assistant

## 2018-06-18 DIAGNOSIS — J029 Acute pharyngitis, unspecified: Secondary | ICD-10-CM

## 2018-06-18 MED ORDER — IBUPROFEN 200 MG PO TABS: 400.0000 mg | ORAL_TABLET | Freq: Three times a day (TID) | ORAL | 0 refills | Status: DC | PRN

## 2018-06-18 MED ORDER — AMOXICILLIN-POT CLAVULANATE 875-125 MG PO TABS: 1.0000 | ORAL_TABLET | Freq: Two times a day (BID) | ORAL | 0 refills | Status: DC

## 2018-06-18 NOTE — Progress Notes (Signed)
We are sorry that you are not feeling well.  Here is how we plan to help!  Based on what you have shared with me it is likely that you have strep pharyngitis.  Pharyngitis is inflammation and infection in the back of the throat.    This is an infection that is likely caused by a virus, however, I have prescribed antibiotics. If symptoms persist start the prescribed antibiotics.  I have prescribed  Augmentin 875 mg/ 125 mg one tablet twice daily for 7 days. It is not recommended that we prescribe antibiotic presumptively for yeast infection. If you develop any symptoms, submit an Evisit. However, I recommend taking an over the counter probiotics that comes in yogurt, gummies, pills, etc.. to help lessen the side effects of the antibiotic. Take Ibuprofen 400 mg three times daily, take it with food. The over the counter throat lozenges, specifically Cepacol, work really well for the pain. Avoid using high dose NSAIDS (Ibuprofen) for prolonged time to avoid complications such as inflammation of the stomach and ulcers.    For throat pain, we recommend over the counter oral pain relief medications such as acetaminophen or aspirin, or anti-inflammatory medications such as ibuprofen or naproxen sodium. Topical treatments such as oral throat lozenges or sprays may be used as needed. Strep infections are not as easily transmitted as other respiratory infections, however we still recommend that you avoid close contact with loved ones, especially the very young and elderly.  Remember to wash your hands thoroughly throughout the day as this is the number one way to prevent the spread of infection and wipe down door knobs and counters with disinfectant.   Home Care:  Only take medications as instructed by your medical team.  Complete the entire course of an antibiotic.  Do not take these medications with alcohol.  A steam or ultrasonic humidifier can help congestion.  You can place a towel over your head and  breathe in the steam from hot water coming from a faucet.  Avoid close contacts especially the very young and the elderly.  Cover your mouth when you cough or sneeze.  Always remember to wash your hands.  Get Help Right Away If:  You develop worsening fever or sinus pain.  You develop a severe head ache or visual changes.  Your symptoms persist after you have completed your treatment plan.  Make sure you  Understand these instructions.  Will watch your condition.  Will get help right away if you are not doing well or get worse.  Your e-visit answers were reviewed by a board certified advanced clinical practitioner to complete your personal care plan.  Depending on the condition, your plan could have included both over the counter or prescription medications.  If there is a problem please reply  once you have received a response from your provider.  Your safety is important to us.  If you have drug allergies check your prescription carefully.    You can use MyChart to ask questions about today's visit, request a non-urgent call back, or ask for a work or school excuse for 24 hours related to this e-Visit. If it has been greater than 24 hours you will need to follow up with your provider, or enter a new e-Visit to address those concerns.  You will get an e-mail in the next two days asking about your experience.  I hope that your e-visit has been valuable and will speed your recovery. Thank you for using e-visits.  I have spent 7 min in completion and review of this note- Illa Level [facility]

## 2018-06-18 NOTE — Addendum Note (Signed)
Addended by: Demetrio Lapping on: 06/18/2018 12:29 PM   Modules accepted: Orders

## 2018-07-26 ENCOUNTER — Encounter: Payer: Self-pay | Admitting: Obstetrics and Gynecology

## 2018-09-14 ENCOUNTER — Other Ambulatory Visit: Payer: Self-pay | Admitting: Obstetrics and Gynecology

## 2018-10-10 ENCOUNTER — Encounter: Payer: Self-pay | Admitting: Obstetrics and Gynecology

## 2018-11-14 ENCOUNTER — Other Ambulatory Visit (HOSPITAL_COMMUNITY)
Admission: RE | Admit: 2018-11-14 | Discharge: 2018-11-14 | Disposition: A | Discharge status: Home / Self Care | Payer: BC Managed Care – PPO | Source: Ambulatory Visit | Attending: Obstetrics and Gynecology | Admitting: Obstetrics and Gynecology

## 2018-11-14 ENCOUNTER — Ambulatory Visit (INDEPENDENT_AMBULATORY_CARE_PROVIDER_SITE_OTHER): Payer: BC Managed Care – PPO | Admitting: Obstetrics and Gynecology

## 2018-11-14 ENCOUNTER — Encounter: Payer: Self-pay | Admitting: Obstetrics and Gynecology

## 2018-11-14 ENCOUNTER — Other Ambulatory Visit: Payer: Self-pay

## 2018-11-14 VITALS — BP 132/84 | HR 98 | Ht 70.0 in | Wt 182.9 lb

## 2018-11-14 DIAGNOSIS — Z23 Encounter for immunization: Secondary | ICD-10-CM | POA: Diagnosis not present

## 2018-11-14 DIAGNOSIS — Z01419 Encounter for gynecological examination (general) (routine) without abnormal findings: Secondary | ICD-10-CM | POA: Diagnosis not present

## 2018-11-14 DIAGNOSIS — Z713 Dietary counseling and surveillance: Secondary | ICD-10-CM

## 2018-11-14 DIAGNOSIS — Z6826 Body mass index (BMI) 26.0-26.9, adult: Secondary | ICD-10-CM

## 2018-11-14 MED ORDER — CYANOCOBALAMIN 1000 MCG/ML IJ SOLN: 1000.0000 ug | INTRAMUSCULAR | 1 refills | Status: DC

## 2018-11-14 MED ORDER — PHENTERMINE HCL 37.5 MG PO TABS: 37.5000 mg | ORAL_TABLET | Freq: Every day | ORAL | 2 refills | Status: DC

## 2018-11-14 NOTE — Patient Instructions (Signed)
 Preventive Care 21-44 Years Old, Female Preventive care refers to visits with your health care provider and lifestyle choices that can promote health and wellness. This includes:  A yearly physical exam. This may also be called an annual well check.  Regular dental visits and eye exams.  Immunizations.  Screening for certain conditions.  Healthy lifestyle choices, such as eating a healthy diet, getting regular exercise, not using drugs or products that contain nicotine and tobacco, and limiting alcohol use. What can I expect for my preventive care visit? Physical exam Your health care provider will check your:  Height and weight. This may be used to calculate body mass index (BMI), which tells if you are at a healthy weight.  Heart rate and blood pressure.  Skin for abnormal spots. Counseling Your health care provider may ask you questions about your:  Alcohol, tobacco, and drug use.  Emotional well-being.  Home and relationship well-being.  Sexual activity.  Eating habits.  Work and work environment.  Method of birth control.  Menstrual cycle.  Pregnancy history. What immunizations do I need?  Influenza (flu) vaccine  This is recommended every year. Tetanus, diphtheria, and pertussis (Tdap) vaccine  You may need a Td booster every 10 years. Varicella (chickenpox) vaccine  You may need this if you have not been vaccinated. Human papillomavirus (HPV) vaccine  If recommended by your health care provider, you may need three doses over 6 months. Measles, mumps, and rubella (MMR) vaccine  You may need at least one dose of MMR. You may also need a second dose. Meningococcal conjugate (MenACWY) vaccine  One dose is recommended if you are age 19-21 years and a first-year college student living in a residence hall, or if you have one of several medical conditions. You may also need additional booster doses. Pneumococcal conjugate (PCV13) vaccine  You may need  this if you have certain conditions and were not previously vaccinated. Pneumococcal polysaccharide (PPSV23) vaccine  You may need one or two doses if you smoke cigarettes or if you have certain conditions. Hepatitis A vaccine  You may need this if you have certain conditions or if you travel or work in places where you may be exposed to hepatitis A. Hepatitis B vaccine  You may need this if you have certain conditions or if you travel or work in places where you may be exposed to hepatitis B. Haemophilus influenzae type b (Hib) vaccine  You may need this if you have certain conditions. You may receive vaccines as individual doses or as more than one vaccine together in one shot (combination vaccines). Talk with your health care provider about the risks and benefits of combination vaccines. What tests do I need?  Blood tests  Lipid and cholesterol levels. These may be checked every 5 years starting at age 20.  Hepatitis C test.  Hepatitis B test. Screening  Diabetes screening. This is done by checking your blood sugar (glucose) after you have not eaten for a while (fasting).  Sexually transmitted disease (STD) testing.  BRCA-related cancer screening. This may be done if you have a family history of breast, ovarian, tubal, or peritoneal cancers.  Pelvic exam and Pap test. This may be done every 3 years starting at age 21. Starting at age 30, this may be done every 5 years if you have a Pap test in combination with an HPV test. Talk with your health care provider about your test results, treatment options, and if necessary, the need for more   tests. Follow these instructions at home: Eating and drinking   Eat a diet that includes fresh fruits and vegetables, whole grains, lean protein, and low-fat dairy.  Take vitamin and mineral supplements as recommended by your health care provider.  Do not drink alcohol if: ? Your health care provider tells you not to drink. ? You are  pregnant, may be pregnant, or are planning to become pregnant.  If you drink alcohol: ? Limit how much you have to 0-1 drink a day. ? Be aware of how much alcohol is in your drink. In the [place] one drink equals one 12 oz bottle of beer (355 mL), one 5 oz glass of wine (148 mL), or one 1 oz glass of hard liquor (44 mL). Lifestyle  Take daily care of your teeth and gums.  Stay active. Exercise for at least 30 minutes on 5 or more days each week.  Do not use any products that contain nicotine or tobacco, such as cigarettes, e-cigarettes, and chewing tobacco. If you need help quitting, ask your health care provider.  If you are sexually active, practice safe sex. Use a condom or other form of birth control (contraception) in order to prevent pregnancy and STIs (sexually transmitted infections). If you plan to become pregnant, see your health care provider for a preconception visit. What's next?  Visit your health care provider once a year for a well check visit.  Ask your health care provider how often you should have your eyes and teeth checked.  Stay up to date on all vaccines. This information is not intended to replace advice given to you by your health care provider. Make sure you discuss any questions you have with your health care provider. Document Released: 03/29/2001 Document Revised: 10/12/2017 Document Reviewed: 10/12/2017 Elsevier Patient Education  2020 [employer].

## 2018-11-14 NOTE — Progress Notes (Signed)
Subjective:   Brenda Walsh is a 43 y.o. G41P1000 Caucasian female here for a routine well-woman exam.  Patient's last menstrual period was 11/07/2018.    Current complaints: none PCP: me       does desire labs  Social History: Sexual: heterosexual Marital Status: married Living situation: with spouse Occupation: self-employed Tobacco/alcohol: no tobacco use Illicit drugs: no history of illicit drug use  The following portions of the patient's history were reviewed and updated as appropriate: allergies, current medications, past family history, past medical history, past social history, past surgical history and problem list.  Past Medical History Past Medical History:  Diagnosis Date  . Heart burn   . Obesity     Past Surgical History History reviewed. No pertinent surgical history.  Gynecologic History G1P1000  Patient's last menstrual period was 11/07/2018. Contraception: NuvaRing vaginal inserts Last Pap: 2016. Results were: normal Last mammogram: 02/2017. Results were: normal   Obstetric History OB History  Gravida Para Term Preterm AB Living  1 1 1         SAB TAB Ectopic Multiple Live Births               # Outcome Date GA Lbr Len/2nd Weight Sex Delivery Anes PTL Lv  1 Term             Current Medications Current Outpatient Medications on File Prior to Visit  Medication Sig Dispense Refill  . etonogestrel-ethinyl estradiol (NUVARING) 0.12-0.015 MG/24HR vaginal ring PLACE 1 RING INTO VAGINA, LEAVE IN FOR 3 WEEKS, THEN REMOVE FOR 1 WEEK 3 each 3  . amoxicillin-clavulanate (AUGMENTIN) 875-125 MG tablet Take 1 tablet by mouth 2 (two) times daily. 14 tablet 0  . cyanocobalamin (,VITAMIN B-12,) 1000 MCG/ML injection Inject 1 mL (1,000 mcg total) into the muscle every 30 (thirty) days. (Patient not taking: Reported on 05/17/2017) 10 mL 1  . ibuprofen (ADVIL) 200 MG tablet Take 2 tablets (400 mg total) by mouth every 8 (eight) hours as needed. 30 tablet 0  . ibuprofen  (ADVIL,MOTRIN) 600 MG tablet Take 1 tablet (600 mg total) by mouth every 6 (six) hours as needed. (Patient not taking: Reported on 05/17/2017) 30 tablet 0  . phentermine (ADIPEX-P) 37.5 MG tablet Take 1 tablet (37.5 mg total) by mouth daily before breakfast. (Patient not taking: Reported on 05/17/2017) 30 tablet 2   No current facility-administered medications on file prior to visit.     Review of Systems Patient denies any headaches, blurred vision, shortness of breath, chest pain, abdominal pain, problems with bowel movements, urination, or intercourse.  Objective:  BP 132/84   Pulse 98   Ht 5\' 10"  (1.778 m)   Wt 182 lb 14.4 oz (83 kg)   LMP 11/07/2018   BMI 26.24 kg/m  Physical Exam  General:  Well developed, well nourished, no acute distress. She is alert and oriented x3. Skin:  Warm and dry Neck:  Midline trachea, no thyromegaly or nodules Cardiovascular: Regular rate and rhythm, no murmur heard Lungs:  Effort normal, all lung fields clear to auscultation bilaterally Breasts:  No dominant palpable mass, retraction, or nipple discharge Abdomen:  Soft, non tender, no hepatosplenomegaly or masses Pelvic:  External genitalia is normal in appearance.  The vagina is normal in appearance. The cervix is bulbous, no CMT.  Thin prep pap is done with HR HPV cotesting. Uterus is felt to be normal size, shape, and contour.  No adnexal masses or tenderness noted. Extremities:  No swelling or varicosities noted  Psych:  She has a normal mood and affect  Assessment:   Healthy well-woman exam BMI 26 HC user Flu vaccine needed   Plan:  Labs obtained and will follow up accordingly Desires restarting adipex- prescription given and B12 injection given Flu vaccine given F/U 1 year for AE, or sooner if needed Mammogram ordered  Jisele Price Suzan Nailer, CNM

## 2018-11-15 LAB — COMPREHENSIVE METABOLIC PANEL
ALT: 12 IU/L (ref 0–32)
AST: 16 IU/L (ref 0–40)
Albumin/Globulin Ratio: 1.3 (ref 1.2–2.2)
Albumin: 4.1 g/dL (ref 3.8–4.8)
Alkaline Phosphatase: 68 IU/L (ref 39–117)
BUN/Creatinine Ratio: 11 (ref 9–23)
BUN: 10 mg/dL (ref 6–24)
Bilirubin Total: 0.2 mg/dL (ref 0.0–1.2)
CO2: 23 mmol/L (ref 20–29)
Calcium: 9.7 mg/dL (ref 8.7–10.2)
Chloride: 104 mmol/L (ref 96–106)
Creatinine, Ser: 0.9 mg/dL (ref 0.57–1.00)
GFR calc Af Amer: 90 mL/min/{1.73_m2} (ref >59)
GFR calc non Af Amer: 78 mL/min/{1.73_m2} (ref >59)
Globulin, Total: 3.2 g/dL (ref 1.5–4.5)
Glucose: 77 mg/dL (ref 65–99)
Potassium: 4.3 mmol/L (ref 3.5–5.2)
Sodium: 142 mmol/L (ref 134–144)
Total Protein: 7.3 g/dL (ref 6.0–8.5)

## 2018-11-15 LAB — HEMOGLOBIN A1C
Est. average glucose Bld gHb Est-mCnc: 103 mg/dL
Hgb A1c MFr Bld: 5.2 % (ref 4.8–5.6)

## 2018-11-15 LAB — CBC
Hematocrit: 41.9 % (ref 34.0–46.6)
Hemoglobin: 14.1 g/dL (ref 11.1–15.9)
MCH: 30.9 pg (ref 26.6–33.0)
MCHC: 33.7 g/dL (ref 31.5–35.7)
MCV: 92 fL (ref 79–97)
Platelets: 330 10*3/uL (ref 150–450)
RBC: 4.56 x10E6/uL (ref 3.77–5.28)
RDW: 12.2 % (ref 11.7–15.4)
WBC: 6.3 10*3/uL (ref 3.4–10.8)

## 2018-11-15 LAB — LIPID PANEL
Chol/HDL Ratio: 2.5 ratio (ref 0.0–4.4)
Cholesterol, Total: 188 mg/dL (ref 100–199)
HDL: 75 mg/dL (ref >39)
LDL Chol Calc (NIH): 99 mg/dL (ref 0–99)
Triglycerides: 74 mg/dL (ref 0–149)
VLDL Cholesterol Cal: 14 mg/dL (ref 5–40)

## 2018-11-15 LAB — VITAMIN D 25 HYDROXY (VIT D DEFICIENCY, FRACTURES): Vit D, 25-Hydroxy: 37.4 ng/mL (ref 30.0–100.0)

## 2018-11-15 LAB — TSH: TSH: 4.31 u[IU]/mL (ref 0.450–4.500)

## 2018-11-19 LAB — CYTOLOGY - PAP
Diagnosis: NEGATIVE
High risk HPV: NEGATIVE

## 2018-11-26 ENCOUNTER — Ambulatory Visit: Payer: BLUE CROSS/BLUE SHIELD

## 2018-12-12 ENCOUNTER — Encounter: Payer: BC Managed Care – PPO | Admitting: Obstetrics and Gynecology

## 2018-12-24 ENCOUNTER — Other Ambulatory Visit: Payer: Self-pay

## 2018-12-24 ENCOUNTER — Ambulatory Visit
Admission: RE | Admit: 2018-12-24 | Discharge: 2018-12-24 | Disposition: A | Discharge status: Home / Self Care | Payer: BC Managed Care – PPO | Source: Ambulatory Visit | Attending: Obstetrics and Gynecology | Admitting: Obstetrics and Gynecology

## 2018-12-24 DIAGNOSIS — Z1231 Encounter for screening mammogram for malignant neoplasm of breast: Secondary | ICD-10-CM | POA: Insufficient documentation

## 2018-12-24 DIAGNOSIS — Z01419 Encounter for gynecological examination (general) (routine) without abnormal findings: Secondary | ICD-10-CM

## 2019-01-02 ENCOUNTER — Encounter: Payer: BC Managed Care – PPO | Admitting: Obstetrics and Gynecology

## 2019-02-18 DIAGNOSIS — Z20822 Contact with and (suspected) exposure to covid-19: Secondary | ICD-10-CM | POA: Diagnosis not present

## 2019-05-16 ENCOUNTER — Telehealth: Payer: BC Managed Care – PPO | Admitting: Physician Assistant

## 2019-05-16 DIAGNOSIS — J020 Streptococcal pharyngitis: Secondary | ICD-10-CM

## 2019-05-16 DIAGNOSIS — J029 Acute pharyngitis, unspecified: Secondary | ICD-10-CM

## 2019-05-16 MED ORDER — PENICILLIN V POTASSIUM 500 MG PO TABS: 500.0000 mg | ORAL_TABLET | Freq: Two times a day (BID) | ORAL | 0 refills | Status: AC

## 2019-05-16 NOTE — Progress Notes (Signed)
We are sorry that you are not feeling well.  Here is how we plan to help!  Based on what you have shared with me it is likely that you have strep pharyngitis.  Strep pharyngitis is inflammation and infection in the back of the throat.  This is an infection cause by bacteria and is treated with antibiotics.  I have prescribed Penicillin V 500 mg twice a day for 10 days. For throat pain, we recommend over the counter oral pain relief medications such as acetaminophen or aspirin, or anti-inflammatory medications such as ibuprofen or naproxen sodium. Topical treatments such as oral throat lozenges or sprays may be used as needed. Strep infections are not as easily transmitted as other respiratory infections, however we still recommend that you avoid close contact with loved ones, especially the very young and elderly.  Remember to wash your hands thoroughly throughout the day as this is the number one way to prevent the spread of infection and wipe down door knobs and counters with disinfectant.   Home Care:  Only take medications as instructed by your medical team.  Complete the entire course of an antibiotic.  Do not take these medications with alcohol.  A steam or ultrasonic humidifier can help congestion.  You can place a towel over your head and breathe in the steam from hot water coming from a faucet.  Avoid close contacts especially the very young and the elderly.  Cover your mouth when you cough or sneeze.  Always remember to wash your hands.  Get Help Right Away If:  You develop worsening fever or sinus pain.  You develop a severe head ache or visual changes.  Your symptoms persist after you have completed your treatment plan.  Make sure you  Understand these instructions.  Will watch your condition.  Will get help right away if you are not doing well or get worse.  Your e-visit answers were reviewed by a board certified advanced clinical practitioner to complete your  personal care plan.  Depending on the condition, your plan could have included both over the counter or prescription medications.  If there is a problem please reply  once you have received a response from your provider.  Your safety is important to us.  If you have drug allergies check your prescription carefully.    You can use MyChart to ask questions about today's visit, request a non-urgent call back, or ask for a work or school excuse for 24 hours related to this e-Visit. If it has been greater than 24 hours you will need to follow up with your provider, or enter a new e-Visit to address those concerns.  You will get an e-mail in the next two days asking about your experience.  I hope that your e-visit has been valuable and will speed your recovery. Thank you for using e-visits.   Greater than 5 minutes, yet less than 10 minutes of time have been spent researching, coordinating and implementing care for this patient today.   

## 2019-11-20 ENCOUNTER — Encounter: Payer: BC Managed Care – PPO | Admitting: Obstetrics and Gynecology

## 2019-12-19 ENCOUNTER — Other Ambulatory Visit: Payer: Self-pay

## 2019-12-19 ENCOUNTER — Telehealth: Payer: Self-pay

## 2019-12-19 MED ORDER — ETONOGESTREL-ETHINYL ESTRADIOL 0.12-0.015 MG/24HR VA RING: VAGINAL_RING | VAGINAL | 12 refills | Status: DC

## 2019-12-19 NOTE — Telephone Encounter (Signed)
mychart message sent to patient

## 2019-12-19 NOTE — Telephone Encounter (Signed)
The pt was called and stated that she cant do 01/07/20. The pt is a Interior and spatial designer. The pt was told that we cant refill her [facility]  that she are requesting to be seen.  The pt was confused and said I guess ill have to be careful. The pt was upset because she needs the refill. I told the pt I will send a message and we will be in touch with her the pt verbally understood. Please advise

## 2019-12-19 NOTE — Telephone Encounter (Signed)
Pt called in and stated that her pharmacy said her [facility] is expired. The pt is requesting a refill. The pt says that she saw Melody and that she doesn't know who she wants to see but she needs a refill on her [facility]. I told the pt that the protocol  is that we need to see when your last annual was and or ur last visit here. The pt was last seen in 2020 I told her we need to make an annual appt. And I can send a message to the the nurse, they can refill it until your annual visit here. The pt requested to know who worked with Melody and I told her about our 2 midwifes. The pt requested a Monday morning appt. We made her appt for 02/24/2020. The [facility] uses [facility] in [place]. Please advise

## 2019-12-19 NOTE — Telephone Encounter (Signed)
Per [employer]- refill patients birth control. Patient has not been seen since 2020 by [facility]. Refilled for 1 year.

## 2019-12-19 NOTE — Telephone Encounter (Signed)
Called pt to inform her nuvaring rx was rxed.   Called pharmacy and updated her rx from 11 refills to 2 refills.  Confirmed with pt that Jan appt was best for her. She states it is as Nov and Dec are her busiest months at work. Will place pt on the wait list just incase.   Pt appreciative of phone call and refill.

## 2019-12-27 ENCOUNTER — Telehealth: Payer: Self-pay

## 2019-12-27 NOTE — Telephone Encounter (Signed)
Called pt Brenda Walsh pt was on waitlist wanted to see if they wanted to move their appt up to 11/15/ at 2:15

## 2019-12-29 DIAGNOSIS — Z20822 Contact with and (suspected) exposure to covid-19: Secondary | ICD-10-CM | POA: Diagnosis not present

## 2020-02-11 ENCOUNTER — Other Ambulatory Visit: Payer: Self-pay | Admitting: Certified Nurse Midwife

## 2020-02-20 ENCOUNTER — Telehealth: Payer: Self-pay

## 2020-02-20 NOTE — Telephone Encounter (Signed)
mychart message sent to patient- re no visitor policy 

## 2020-02-24 ENCOUNTER — Encounter: Payer: Self-pay | Admitting: Certified Nurse Midwife

## 2020-02-24 ENCOUNTER — Ambulatory Visit (INDEPENDENT_AMBULATORY_CARE_PROVIDER_SITE_OTHER): Payer: BC Managed Care – PPO | Admitting: Certified Nurse Midwife

## 2020-02-24 ENCOUNTER — Other Ambulatory Visit: Payer: Self-pay

## 2020-02-24 VITALS — BP 125/86 | HR 80 | Ht 70.0 in | Wt 187.4 lb

## 2020-02-24 DIAGNOSIS — Z3044 Encounter for surveillance of vaginal ring hormonal contraceptive device: Secondary | ICD-10-CM

## 2020-02-24 DIAGNOSIS — Z01419 Encounter for gynecological examination (general) (routine) without abnormal findings: Secondary | ICD-10-CM

## 2020-02-24 DIAGNOSIS — Z1231 Encounter for screening mammogram for malignant neoplasm of breast: Secondary | ICD-10-CM | POA: Diagnosis not present

## 2020-02-24 DIAGNOSIS — R21 Rash and other nonspecific skin eruption: Secondary | ICD-10-CM | POA: Diagnosis not present

## 2020-02-24 DIAGNOSIS — Z6826 Body mass index (BMI) 26.0-26.9, adult: Secondary | ICD-10-CM

## 2020-02-24 MED ORDER — ETONOGESTREL-ETHINYL ESTRADIOL 0.12-0.015 MG/24HR VA RING: VAGINAL_RING | VAGINAL | 3 refills | Status: DC

## 2020-02-24 MED ORDER — NYSTATIN-TRIAMCINOLONE 100000-0.1 UNIT/GM-% EX CREA: 1.0000 "application " | TOPICAL_CREAM | Freq: Two times a day (BID) | CUTANEOUS | 0 refills | Status: DC

## 2020-02-24 MED ORDER — CLOBETASOL PROPIONATE 0.05 % EX OINT: 1.0000 "application " | TOPICAL_OINTMENT | Freq: Two times a day (BID) | CUTANEOUS | 2 refills | Status: DC

## 2020-02-24 NOTE — Progress Notes (Signed)
ANNUAL PREVENTATIVE CARE GYN  ENCOUNTER NOTE  Subjective:       Brenda Walsh is a 45 y.o. G73P1001 female here for a routine annual gynecologic exam.  Current complaints: 1. Fine itchy bump like rash under both breasts for the last few weeks 2. Requests NuvaRing and Clobetasol refill 3. Needs Mammogram 4. Questions restarting Phentermine and B-12 injections for weight loss   Denies breathing difficulty, respiratory distress, chest pain, abdominal pain, leg pain or swelling.  Gynecologic History  Patient's last menstrual period was 01/27/2020 (approximate).  Contraception: NuvaRing vaginal inserts  Last Pap: 11/14/2018. Results were: Neg/Neg  Last mammogram: 12/24/2018. Results were:BI-RADS 1  Obstetric History  OB History  Gravida Para Term Preterm AB Living  1 1 1     1   SAB IAB Ectopic Multiple Live Births          1    # Outcome Date GA Lbr Len/2nd Weight Sex Delivery Anes PTL Lv  1 Term 03/21/96 [redacted]w[redacted]d  3487 g M Vag-Spont Other N LIV    Past Medical History:  Diagnosis Date  . Heart burn   . Obesity     No past surgical history on file.  No Known Allergies  Social History   Socioeconomic History  . Marital status: Married    Spouse name: Not on file  . Number of children: Not on file  . Years of education: Not on file  . Highest education level: Not on file  Occupational History  . Not on file  Tobacco Use  . Smoking status: Never Smoker  . Smokeless tobacco: Never Used  Vaping Use  . Vaping Use: Never used  Substance and Sexual Activity  . Alcohol use: Yes    Comment: OCCASIONAL  . Drug use: No  . Sexual activity: Yes    Birth control/protection: Inserts  Other Topics Concern  . Not on file  Social History Narrative  . Not on file   Social Determinants of Health   Financial Resource Strain: Not on file  Food Insecurity: Not on file  Transportation Needs: Not on file  Physical Activity: Not on file  Stress: Not on file  Social Connections:  Not on file  Intimate Partner Violence: Not on file    Family History  Problem Relation Age of Onset  . Breast cancer Neg Hx     The following portions of the patient's history were reviewed and updated as appropriate: allergies, current medications, past family history, past medical history, past social history, past surgical history and problem list.  Review of Systems  ROS- Negative other then what was reported above. Information obtained verbally from patient.   Objective:   BP 125/86   Pulse 80   Ht 5\' 10"  (1.778 m)   Wt 85 kg   LMP 01/27/2020 (Approximate)   BMI 26.89 kg/m    CONSTITUTIONAL: Well-developed, well-nourished female in no acute distress.   PSYCHIATRIC: Normal mood and affect. Normal behavior. Normal judgment and thought content.  NEUROLGIC: Alert and oriented to person, place, and time. Normal muscle tone coordination. No cranial nerve deficit noted.  EYES: Conjunctivae and EOM are normal.   NECK: Normal range of motion, supple, no masses.  Normal thyroid.   SKIN: Skin is warm and dry.  Not diaphoretic. No pallor. Rash noted under and around bilateral breast, red with fine bumps noted. Redness noted on right great toe (from [place] use)  CARDIOVASCULAR: Normal heart rate noted, regular rhythm, no murmur.  RESPIRATORY: Clear  to auscultation bilaterally. Effort and breath sounds normal, no problems with respiration noted.  BREASTS: Symmetric in size. No masses, skin changes, nipple drainage, or lymphadenopathy.  ABDOMEN: Soft, normal bowel sounds, no distention noted.  No tenderness, rebound or guarding.   PELVIC:  External Genitalia: Normal  Vagina: Normal  Cervix: Normal, Nuvaring in place   Uterus: Normal  Adnexa: Normal  MUSCULOSKELETAL: Normal range of motion. No tenderness.  No cyanosis, clubbing, or edema.   Assessment:   Annual gynecologic examination 45 y.o.   Contraception: NuvaRing vaginal inserts   Overweight   Problem List Items  Addressed This Visit   None   Visit Diagnoses    Well woman exam    -  Primary   Relevant Orders   MM 3D SCREEN BREAST BILATERAL   BMI 26.0-26.9,adult       Screening mammogram for breast cancer       Relevant Orders   MM 3D SCREEN BREAST BILATERAL   Rash       Encounter for surveillance of vaginal ring hormonal contraceptive device         Plan:   Pap: Not needed  Mammogram: Ordered  Labs: Declined  Routine preventative health maintenance measures emphasized: Exercise/Diet/Weight control, Tobacco Warnings, Alcohol/Substance use risks, Stress Management and Peer Pressure Issues; see AVS  Rx- NuvaRing, Clobetasol, and Mycolog II, see orders  Reviewed red flag symptoms and when to call  RTC x 1 year or sooner if needed   Juliann Pares, [employer] 02/24/20 9:52 AM

## 2020-02-24 NOTE — Progress Notes (Signed)
I have seen, interviewed, and examined the patient in conjunction with the [facility] [employer] and affirm the diagnosis and management plan.   Gunnar Bulla, CNM [facility], [facility] 02/24/20 11:56 AM

## 2020-02-24 NOTE — Patient Instructions (Signed)
Etonogestrel; Ethinyl Estradiol Vaginal Ring What is this medicine? ETONOGESTREL; ETHINYL ESTRADIOL (et oh noe JES trel; ETH in il es tra DYE ole) vaginal ring is a flexible, vaginal ring used as a contraceptive (birth control method). This product combines two types of female hormones, an estrogen and a progestin. It is used to prevent ovulation and pregnancy. Each ring is effective for 1 month. This medicine may be used for other purposes; ask your health care provider or pharmacist if you have questions. COMMON BRAND NAME(S): EluRyng, NuvaRing What should I tell my health care provider before I take this medicine? They need to know if you have any of these conditions: abnormal vaginal bleeding blood vessel disease or blood clots breast, cervical, endometrial, ovarian, liver, or uterine cancer diabetes gallbladder disease having surgery heart disease or recent heart attack high blood pressure high cholesterol or triglycerides history of irregular heartbeat or heart valve problems kidney disease liver disease migraine headaches protein C deficiency protein S deficiency recently had a baby, miscarriage, or abortion stroke systemic lupus erythematosus (SLE) tobacco smoker your age is more than 46 years old an unusual or allergic reaction to estrogens, progestins, other medicines, foods, dyes, or preservatives pregnant or trying to get pregnant breast-feeding How should I use this medicine? Insert the ring into your vagina as directed. Follow the directions on the prescription label. The ring will remain place for 3 weeks and is then removed for a 1-week break. A new ring is inserted 1 week after the last ring was removed, on the same day of the week. Check often to make sure the ring is still in place. If the ring was out of the vagina for an unknown amount of time, you may not be protected from pregnancy. Perform a pregnancy test and call your doctor. Do not use more often than  directed. A patient package insert for the product will be given with each prescription and refill. Read this sheet carefully each time. The sheet may change frequently. Contact your pediatrician regarding the use of this medicine in children. Special care may be needed. Overdosage: If you think you have taken too much of this medicine contact a poison control center or emergency room at once. NOTE: This medicine is only for you. Do not share this medicine with others. What if I miss a dose? You will need to use the ring exactly as directed. It is very important to follow the schedule every cycle. If you do not use the ring as directed, you may not be protected from pregnancy. If the ring should slip out, is lost, or if you leave it in longer or shorter than you should, contact your health care professional for advice. What may interact with this medicine? Do not take this medicine with the following medications: dasabuvir; ombitasvir; paritaprevir; ritonavir ombitasvir; paritaprevir; ritonavir vaginal lubricants or other vaginal products that are oil-based or silicone-based This medicine may also interact with the following medications: acetaminophen antibiotics or medicines for infections, especially rifampin, rifabutin, rifapentine, and griseofulvin, and possibly penicillins or tetracyclines aprepitant or fosaprepitant armodafinil ascorbic acid (vitamin C) barbiturate medicines, such as phenobarbital or primidone bosentan certain antiviral medicines for hepatitis, HIV or AIDS certain medicines for cancer treatment certain medicines for seizures like carbamazepine, clobazam, felbamate, lamotrigine, oxcarbazepine, phenytoin, rufinamide, topiramate certain medicines for treating high cholesterol cyclosporine dantrolene elagolix flibanserin grapefruit juice lesinurad medicines for diabetes medicines to treat fungal infections, such as griseofulvin, miconazole, fluconazole, ketoconazole,  itraconazole, posaconazole or voriconazole mifepristone mitotane modafinil   morphine mycophenolate St. John's wort tamoxifen temazepam theophylline or aminophylline thyroid hormones tizanidine tranexamic acid ulipristal warfarin This list may not describe all possible interactions. Give your health care provider a list of all the medicines, herbs, non-prescription drugs, or dietary supplements you use. Also tell them if you smoke, drink alcohol, or use illegal drugs. Some items may interact with your medicine. What should I watch for while using this medicine? Visit your doctor or health care professional for regular checks on your progress. You will need a regular breast and pelvic exam and Pap smear while on this medicine. Check with your doctor or health care professional to see if you need an additional method of contraception during the first cycle that you use this ring. Female condoms (made with natural rubber latex, polyisoprene, and polyurethane) and spermicides may be used. Do not use a diaphragm, cervical cap, or a female condom, as the ring can interfere with these birth control methods and their proper placement. If you have any reason to think you are pregnant, stop using this medicine right away and contact your doctor or health care professional. If you are using this medicine for hormone related problems, it may take several cycles of use to see improvement in your condition. Smoking increases the risk of getting a blood clot or having a stroke while you are using hormonal birth control, especially if you are more than 46 years old. You are strongly advised not to smoke. Some women are prone to getting dark patches on the skin of the face (cholasma). Your risk of getting chloasma with this medicine is higher if you had chloasma during a pregnancy. Keep out of the sun. If you cannot avoid being in the sun, wear protective clothing and use sunscreen. Do not use sun lamps or tanning  beds/booths. This medicine can make your body retain fluid, making your fingers, hands, or ankles swell. Your blood pressure can go up. Contact your doctor or health care professional if you feel you are retaining fluid. If you are going to have elective surgery, you may need to stop using this medicine before the surgery. Consult your health care professional for advice. This medicine does not protect you against HIV infection (AIDS) or any other sexually transmitted diseases. What side effects may I notice from receiving this medicine? Side effects that you should report to your doctor or health care professional as soon as possible: allergic reactions such as skin rash or itching, hives, swelling of the lips, mouth, tongue, or throat depression high blood pressure migraines or severe, sudden headaches signs and symptoms of a blood clot such as breathing problems; changes in vision; chest pain; severe, sudden headache; pain, swelling, warmth in the leg; trouble speaking; sudden numbness or weakness of the face, arm or leg signs and symptoms of infection like fever or chills with dizziness and a sunburn-like rash, or pain or trouble passing urine stomach pain symptoms of vaginal infection like itching, irritation or unusual discharge yellowing of the eyes or skin Side effects that usually do not require medical attention (report these to your doctor or health care professional if they continue or are bothersome): acne breast pain, tenderness irregular vaginal bleeding or spotting, particularly during the first month of use mild headache nausea painful periods vomiting This list may not describe all possible side effects. Call your doctor for medical advice about side effects. You may report side effects to FDA at 1-800-FDA-1088. Where should I keep my medicine? Keep out of the   health care professional if they continue or are bothersome):  acne  breast pain, tenderness  irregular vaginal bleeding or spotting, particularly during the first month of use  mild headache  nausea  painful periods  vomiting This list may not describe all possible side effects. Call your doctor for medical advice about side effects. You may report side effects to FDA at 1-800-FDA-1088. Where should I keep  my medicine? Keep out of the reach of children. Store unopened medicine for up to 4 months at room temperature at 15 and 30 degrees C (59 and 86 degrees F). Protect from light. Do not store above 30 degrees C (86 degrees F). Throw away any unused medicine 4 months after the dispense date or the expiration date, whichever comes first. A ring may only be used for 1 cycle (1 month). After the 3-week cycle, a used ring is removed and should be placed in the re-closable foil pouch and discarded in the trash out of reach of children and pets. Do NOT flush down the toilet. NOTE: This sheet is a summary. It may not cover all possible information. If you have questions about this medicine, talk to your doctor, pharmacist, or health care provider.  2021 Elsevier/Gold Standard (2018-12-19 20:36:29)   Preventive Care 37-29 Years Old, Female Preventive care refers to lifestyle choices and visits with your health care provider that can promote health and wellness. This includes:  A yearly physical exam. This is also called an annual wellness visit.  Regular dental and eye exams.  Immunizations.  Screening for certain conditions.  Healthy lifestyle choices, such as: ? Eating a healthy diet. ? Getting regular exercise. ? Not using drugs or products that contain nicotine and tobacco. ? Limiting alcohol use. What can I expect for my preventive care visit? Physical exam Your health care provider will check your:  Height and weight. These may be used to calculate your BMI (body mass index). BMI is a measurement that tells if you are at a healthy weight.  Heart rate and blood pressure.  Body temperature.  Skin for abnormal spots. Counseling Your health care provider may ask you questions about your:  Past medical problems.  Family's medical history.  Alcohol, tobacco, and drug use.  Emotional well-being.  Home life and relationship well-being.  Sexual activity.  Diet, exercise, and sleep  habits.  Work and work Statistician.  Access to firearms.  Method of birth control.  Menstrual cycle.  Pregnancy history. What immunizations do I need? Vaccines are usually given at various ages, according to a schedule. Your health care provider will recommend vaccines for you based on your age, medical history, and lifestyle or other factors, such as travel or where you work.   What tests do I need? Blood tests  Lipid and cholesterol levels. These may be checked every 5 years, or more often if you are over 59 years old.  Hepatitis C test.  Hepatitis B test. Screening  Lung cancer screening. You may have this screening every year starting at age 83 if you have a 30-pack-year history of smoking and currently smoke or have quit within the past 15 years.  Colorectal cancer screening. ? All adults should have this screening starting at age 61 and continuing until age 56. ? Your health care provider may recommend screening at age 9 if you are at increased risk. ? You will have tests every 1-10 years, depending on your results and the type of screening test.  Diabetes screening. ? This  is done by checking your blood sugar (glucose) after you have not eaten for a while (fasting). ? You may have this done every 1-3 years.  Mammogram. ? This may be done every 1-2 years. ? Talk with your health care provider about when you should start having regular mammograms. This may depend on whether you have a family history of breast cancer.  BRCA-related cancer screening. This may be done if you have a family history of breast, ovarian, tubal, or peritoneal cancers.  Pelvic exam and Pap test. ? This may be done every 3 years starting at age 53. ? Starting at age 26, this may be done every 5 years if you have a Pap test in combination with an HPV test. Other tests  STD (sexually transmitted disease) testing, if you are at risk.  Bone density scan. This is done to screen for osteoporosis.  You may have this scan if you are at high risk for osteoporosis. Talk with your health care provider about your test results, treatment options, and if necessary, the need for more tests. Follow these instructions at home: Eating and drinking  Eat a diet that includes fresh fruits and vegetables, whole grains, lean protein, and low-fat dairy products.  Take vitamin and mineral supplements as recommended by your health care provider.  Do not drink alcohol if: ? Your health care provider tells you not to drink. ? You are pregnant, may be pregnant, or are planning to become pregnant.  If you drink alcohol: ? Limit how much you have to 0-1 drink a day. ? Be aware of how much alcohol is in your drink. In the [place] one drink equals one 12 oz bottle of beer (355 mL), one 5 oz glass of wine (148 mL), or one 1 oz glass of hard liquor (44 mL).   Lifestyle  Take daily care of your teeth and gums. Brush your teeth every morning and night with fluoride toothpaste. Floss one time each day.  Stay active. Exercise for at least 30 minutes 5 or more days each week.  Do not use any products that contain nicotine or tobacco, such as cigarettes, e-cigarettes, and chewing tobacco. If you need help quitting, ask your health care provider.  Do not use drugs.  If you are sexually active, practice safe sex. Use a condom or other form of protection to prevent STIs (sexually transmitted infections).  If you do not wish to become pregnant, use a form of birth control. If you plan to become pregnant, see your health care provider for a prepregnancy visit.  If told by your health care provider, take low-dose aspirin daily starting at age 22.  Find healthy ways to cope with stress, such as: ? Meditation, yoga, or listening to music. ? Journaling. ? Talking to a trusted person. ? Spending time with friends and family. Safety  Always wear your seat belt while driving or riding in a vehicle.  Do not  drive: ? If you have been drinking alcohol. Do not ride with someone who has been drinking. ? When you are tired or distracted. ? While texting.  Wear a helmet and other protective equipment during sports activities.  If you have firearms in your house, make sure you follow all gun safety procedures. What's next?  Visit your health care provider once a year for an annual wellness visit.  Ask your health care provider how often you should have your eyes and teeth checked.  Stay up to date on all vaccines.  This information is not intended to replace advice given to you by your health care provider. Make sure you discuss any questions you have with your health care provider. Document Revised: 11/05/2019 Document Reviewed: 10/12/2017 Elsevier Patient Education  2021 [employer].

## 2020-03-23 ENCOUNTER — Ambulatory Visit
Admission: RE | Admit: 2020-03-23 | Discharge: 2020-03-23 | Disposition: A | Discharge status: Home / Self Care | Payer: BC Managed Care – PPO | Source: Ambulatory Visit | Attending: Certified Nurse Midwife | Admitting: Certified Nurse Midwife

## 2020-03-23 ENCOUNTER — Other Ambulatory Visit: Payer: Self-pay

## 2020-03-23 DIAGNOSIS — Z1231 Encounter for screening mammogram for malignant neoplasm of breast: Secondary | ICD-10-CM | POA: Diagnosis not present

## 2020-03-23 DIAGNOSIS — Z01419 Encounter for gynecological examination (general) (routine) without abnormal findings: Secondary | ICD-10-CM

## 2020-06-09 ENCOUNTER — Telehealth: Payer: BC Managed Care – PPO | Admitting: Nurse Practitioner

## 2020-06-09 DIAGNOSIS — E663 Overweight: Secondary | ICD-10-CM | POA: Insufficient documentation

## 2020-06-09 DIAGNOSIS — Z20818 Contact with and (suspected) exposure to other bacterial communicable diseases: Secondary | ICD-10-CM | POA: Diagnosis not present

## 2020-06-09 DIAGNOSIS — J020 Streptococcal pharyngitis: Secondary | ICD-10-CM

## 2020-06-09 MED ORDER — AZITHROMYCIN 250 MG PO TABS: ORAL_TABLET | ORAL | 0 refills | Status: AC

## 2020-06-09 NOTE — Progress Notes (Signed)
E-Visit for Sore Throat  We are sorry that you are not feeling well.  Here is how we plan to help!  Your symptoms indicate a likely Pharyngitis.   Pharyngitis is inflammation in the back of the throat which can cause a sore throat, scratchiness and sometimes difficulty swallowing.   Pharyngitis is typically caused by a respiratory virus and will just run its course.    Since you have had a close exposure to strep and are familiar with strep symptoms from having it in the past we will go ahead and cover you with antibiotics due to the exposure, and symptoms present.   Please keep in mind that your symptoms could last up to 10 days.  For throat pain, we recommend over the counter oral pain relief medications such as acetaminophen or aspirin, or anti-inflammatory medications such as ibuprofen or naproxen sodium.  Topical treatments such as oral throat lozenges or sprays may be used as needed.  Avoid close contact with loved ones, especially the very young and elderly.    Remember to wash your hands thoroughly throughout the day as this is the number one way to prevent the spread of infection and wipe down door knobs and counters with disinfectant.  I will prescribe Azithromycin for you, this antibiotic is to be taken over 5 days. Today you will take 2 tablets, and then one daily for the next 4 days. This medication is best taken with food. You will be considered contagious for 24 hours after starting the antibiotic.   Be sure to increase your fluid intake and rest as well.   Providers prescribe antibiotics to treat infections caused by bacteria. Antibiotics are very powerful in treating bacterial infections when they are used properly.  To maintain their effectiveness, they should be used only when necessary.    Home Care:  Only take medications as instructed by your medical team.  Do not drink alcohol while taking these medications.  A steam or ultrasonic humidifier can help congestion.  You  can place a towel over your head and breathe in the steam from hot water coming from a faucet.  Avoid close contacts especially the very young and the elderly.  Cover your mouth when you cough or sneeze.  Always remember to wash your hands.  Get Help Right Away If:  You develop worsening fever or throat pain.  You develop a severe head ache or visual changes.  Your symptoms persist after you have completed your treatment plan.  Make sure you  Understand these instructions.  Will watch your condition.  Will get help right away if you are not doing well or get worse.  Your e-visit answers were reviewed by a board certified advanced clinical practitioner to complete your personal care plan.  Depending on the condition, your plan could have included both over the counter or prescription medications.  If there is a problem please reply  once you have received a response from your provider.  Your safety is important to [place].  If you have drug allergies check your prescription carefully.    You can use MyChart to ask questions about todays visit, request a non-urgent call back, or ask for a work or school excuse for 24 hours related to this e-Visit. If it has been greater than 24 hours you will need to follow up with your provider, or enter a new e-Visit to address those concerns.  You will get an e-mail in the next two days asking about your experience.  I hope that your e-visit has been valuable and will speed your recovery. Thank you for using e-visits.  Approximately 5 minutes were spent reviewing the patient's chart, history and symptoms. Patient is treated based on [facility] criteria and due to close exposure with symptoms present for strep.  Meds ordered this encounter  Medications  . azithromycin (ZITHROMAX) 250 MG tablet    Sig: Take 2 tablets on day 1, then 1 tablet daily on days 2 through 5    Dispense:  6 tablet    Refill:  0

## 2021-03-31 DIAGNOSIS — L400 Psoriasis vulgaris: Secondary | ICD-10-CM | POA: Diagnosis not present

## 2021-05-12 ENCOUNTER — Other Ambulatory Visit: Payer: Self-pay | Admitting: Certified Nurse Midwife

## 2021-05-12 DIAGNOSIS — Z1231 Encounter for screening mammogram for malignant neoplasm of breast: Secondary | ICD-10-CM

## 2021-06-28 ENCOUNTER — Ambulatory Visit
Admission: RE | Admit: 2021-06-28 | Discharge: 2021-06-28 | Disposition: A | Discharge status: Home / Self Care | Payer: BC Managed Care – PPO | Source: Ambulatory Visit | Attending: Certified Nurse Midwife | Admitting: Certified Nurse Midwife

## 2021-06-28 ENCOUNTER — Other Ambulatory Visit: Payer: Self-pay | Admitting: Certified Nurse Midwife

## 2021-06-28 DIAGNOSIS — Z1231 Encounter for screening mammogram for malignant neoplasm of breast: Secondary | ICD-10-CM | POA: Diagnosis not present

## 2021-06-30 DIAGNOSIS — Z79899 Other long term (current) drug therapy: Secondary | ICD-10-CM | POA: Diagnosis not present

## 2021-06-30 DIAGNOSIS — L298 Other pruritus: Secondary | ICD-10-CM | POA: Diagnosis not present

## 2021-06-30 DIAGNOSIS — L400 Psoriasis vulgaris: Secondary | ICD-10-CM | POA: Diagnosis not present

## 2021-07-01 ENCOUNTER — Other Ambulatory Visit: Payer: Self-pay | Admitting: Certified Nurse Midwife

## 2021-07-01 DIAGNOSIS — N6489 Other specified disorders of breast: Secondary | ICD-10-CM

## 2021-07-01 DIAGNOSIS — R928 Other abnormal and inconclusive findings on diagnostic imaging of breast: Secondary | ICD-10-CM

## 2021-07-07 ENCOUNTER — Ambulatory Visit
Admission: RE | Admit: 2021-07-07 | Discharge: 2021-07-07 | Disposition: A | Discharge status: Home / Self Care | Payer: BC Managed Care – PPO | Source: Ambulatory Visit | Attending: Certified Nurse Midwife | Admitting: Certified Nurse Midwife

## 2021-07-07 DIAGNOSIS — R928 Other abnormal and inconclusive findings on diagnostic imaging of breast: Secondary | ICD-10-CM | POA: Diagnosis not present

## 2021-07-07 DIAGNOSIS — N6489 Other specified disorders of breast: Secondary | ICD-10-CM

## 2021-09-29 DIAGNOSIS — Z79899 Other long term (current) drug therapy: Secondary | ICD-10-CM | POA: Diagnosis not present

## 2021-09-29 DIAGNOSIS — L400 Psoriasis vulgaris: Secondary | ICD-10-CM | POA: Diagnosis not present

## 2022-01-21 ENCOUNTER — Other Ambulatory Visit: Payer: Self-pay

## 2022-01-21 MED ORDER — ETONOGESTREL-ETHINYL ESTRADIOL 0.12-0.015 MG/24HR VA RING: VAGINAL_RING | VAGINAL | 3 refills | Status: AC

## 2022-03-07 ENCOUNTER — Ambulatory Visit: Payer: BC Managed Care – PPO | Admitting: Obstetrics

## 2022-03-14 ENCOUNTER — Encounter: Payer: Self-pay | Admitting: Obstetrics

## 2022-03-14 ENCOUNTER — Ambulatory Visit (INDEPENDENT_AMBULATORY_CARE_PROVIDER_SITE_OTHER): Payer: BC Managed Care – PPO | Admitting: Obstetrics

## 2022-03-14 VITALS — BP 135/85 | HR 88 | Ht 70.0 in | Wt 192.6 lb

## 2022-03-14 DIAGNOSIS — Z1231 Encounter for screening mammogram for malignant neoplasm of breast: Secondary | ICD-10-CM

## 2022-03-14 DIAGNOSIS — Z01419 Encounter for gynecological examination (general) (routine) without abnormal findings: Secondary | ICD-10-CM

## 2022-03-14 NOTE — Progress Notes (Signed)
SUBJECTIVE  HPI  Brenda Walsh is a 47 y.o.-year-old G1P1001 who presents for an annual physical exam today. She has no health concerns today. She uses Nuvaring for contraception and is pleased with this method. She denies pelvic pain, abnormal bleeding/discharge, dyspareunia, UTI symptoms, and perimenopausal symptoms.  Medical/Surgical History Past Medical History:  Diagnosis Date   Heart burn    Obesity    History reviewed. No pertinent surgical history.  Social History Lives with husband. Feels safe there. Work: Probation officer Exercise: gym 3x/week Substances: EtOH 1x/week, denies tobacco, vape, and recreational drugs  Obstetric History OB History     Gravida  1   Para  1   Term  1   Preterm      AB      Living  1      SAB      IAB      Ectopic      Multiple      Live Births  1            GYN/Menstrual History Patient's last menstrual period was 02/28/2022 (within days). Regular monthly periods Last Pap: 11/14/2018 - NILM/neg HPV Contraception: Nuvaring  Prevention Dentist Eye exam Mammogram Colonoscopy Flu shot/vaccines  Current Medications Outpatient Medications Prior to Visit  Medication Sig   etonogestrel-ethinyl estradiol (NUVARING) 0.12-0.015 MG/24HR vaginal ring PLACE 1 RING INTO VAGINA, LEAVE IN FOR 3 WEEKS, THEN REMOVE FOR 1 WEEK   [DISCONTINUED] clobetasol ointment (TEMOVATE) 9.70 % Apply 1 application topically 2 (two) times daily. Apply to affected area   [DISCONTINUED] nystatin-triamcinolone (MYCOLOG II) cream Apply 1 application topically 2 (two) times daily.   No facility-administered medications prior to visit.      The pregnancy intention screening data noted above was reviewed. Potential methods of contraception were discussed. The patient elected to proceed with No data recorded.   ROS Constitutional: Denied constitutional symptoms, night sweats, recent illness, fatigue, fever, insomnia and weight loss.  Eyes: Denied  eye symptoms, eye pain, photophobia, vision change and visual disturbance.  Ears/Nose/Throat/Neck: Denied ear, nose, throat or neck symptoms, hearing loss, nasal discharge, sinus congestion and sore throat.  Cardiovascular: Denied cardiovascular symptoms, arrhythmia, chest pain/pressure, edema, exercise intolerance, orthopnea and palpitations.  Respiratory: Denied pulmonary symptoms, asthma, pleuritic pain, productive sputum, cough, dyspnea and wheezing.  Gastrointestinal: Denied, gastro-esophageal reflux, melena, nausea and vomiting.  Genitourinary: Denied genitourinary symptoms including symptomatic vaginal discharge, pelvic relaxation issues, and urinary complaints.  Musculoskeletal: Denied musculoskeletal symptoms, stiffness, swelling, muscle weakness and myalgia.  Dermatologic: Denied dermatology symptoms, rash and scar.  Neurologic: Denied neurology symptoms, dizziness, headache, neck pain and syncope.  Psychiatric: Denied psychiatric symptoms, anxiety and depression.  Endocrine: Denied endocrine symptoms including hot flashes and night sweats.    OBJECTIVE  BP 135/85   Pulse 88   Ht 5\' 10"  (1.778 m)   Wt 192 lb 9.6 oz (87.4 kg)   LMP 02/28/2022 (Within Days)   BMI 27.64 kg/m    Last Weight  Most recent update: 03/14/2022  2:04 PM    Weight  87.4 kg (192 lb 9.6 oz)             Body mass index is 27.64 kg/m.   Physical examination General NAD, Conversant  HEENT Atraumatic; Op clear with mmm.  Normo-cephalic. Pupils reactive. Anicteric sclerae  Thyroid/Neck Smooth without nodularity or enlargement. Normal ROM.  Neck Supple.  Skin No rashes, lesions or ulceration. Normal palpated skin turgor. No nodularity.  Breasts: Declines exam  Lungs:  Clear to auscultation.No rales or wheezes. Normal Respiratory effort, no retractions.  Heart: NSR.  No murmurs or rubs appreciated. No peripheral edema  Abdomen: Soft.  Non-tender.  No masses.  No HSM. No hernia  Extremities: Moves all  appropriately.  Normal ROM for age. No lymphadenopathy.  Neuro: Oriented to PPT.  Normal mood. Normal affect.     Pelvic:  Declines exam    ASSESSMENT   PLAN 1) Physical exam as noted. Discussed healthy lifestyle choices and preventive care. Declines STI testing. Labs: CBC, A1C, CMP, TSH, lipid profile, vitamin D. Mammogram and colonoscopy ordered. Will continue Nuvaring. 2) Pap: Due 10/2023  Return in one year for annual exam or as needed for concerns.   Lloyd Huger, CNM

## 2022-03-15 LAB — CBC
Hematocrit: 40.4 % (ref 34.0–46.6)
Hemoglobin: 13.9 g/dL (ref 11.1–15.9)
MCH: 31.6 pg (ref 26.6–33.0)
MCHC: 34.4 g/dL (ref 31.5–35.7)
MCV: 92 fL (ref 79–97)
Platelets: 357 10*3/uL (ref 150–450)
RBC: 4.4 x10E6/uL (ref 3.77–5.28)
RDW: 12.2 % (ref 11.7–15.4)
WBC: 8.1 10*3/uL (ref 3.4–10.8)

## 2022-03-15 LAB — LIPID PANEL
Chol/HDL Ratio: 2.9 ratio (ref 0.0–4.4)
Cholesterol, Total: 201 mg/dL — ABNORMAL HIGH (ref 100–199)
HDL: 70 mg/dL (ref >39)
LDL Chol Calc (NIH): 115 mg/dL — ABNORMAL HIGH (ref 0–99)
Triglycerides: 88 mg/dL (ref 0–149)
VLDL Cholesterol Cal: 16 mg/dL (ref 5–40)

## 2022-03-15 LAB — COMPREHENSIVE METABOLIC PANEL
ALT: 11 IU/L (ref 0–32)
AST: 14 IU/L (ref 0–40)
Albumin/Globulin Ratio: 1.5 (ref 1.2–2.2)
Albumin: 4.1 g/dL (ref 3.9–4.9)
Alkaline Phosphatase: 67 IU/L (ref 44–121)
BUN/Creatinine Ratio: 16 (ref 9–23)
BUN: 13 mg/dL (ref 6–24)
Bilirubin Total: 0.2 mg/dL (ref 0.0–1.2)
CO2: 19 mmol/L — ABNORMAL LOW (ref 20–29)
Calcium: 9.3 mg/dL (ref 8.7–10.2)
Chloride: 102 mmol/L (ref 96–106)
Creatinine, Ser: 0.82 mg/dL (ref 0.57–1.00)
Globulin, Total: 2.8 g/dL (ref 1.5–4.5)
Glucose: 94 mg/dL (ref 70–99)
Potassium: 4.5 mmol/L (ref 3.5–5.2)
Sodium: 138 mmol/L (ref 134–144)
Total Protein: 6.9 g/dL (ref 6.0–8.5)
eGFR: 89 mL/min/{1.73_m2} (ref >59)

## 2022-03-15 LAB — VITAMIN D 25 HYDROXY (VIT D DEFICIENCY, FRACTURES): Vit D, 25-Hydroxy: 35.1 ng/mL (ref 30.0–100.0)

## 2022-03-15 LAB — HEMOGLOBIN A1C
Est. average glucose Bld gHb Est-mCnc: 114 mg/dL
Hgb A1c MFr Bld: 5.6 % (ref 4.8–5.6)

## 2022-03-15 LAB — TSH: TSH: 3.27 u[IU]/mL (ref 0.450–4.500)

## 2022-03-16 ENCOUNTER — Encounter: Payer: Self-pay | Admitting: Obstetrics

## 2022-04-25 DIAGNOSIS — Z79899 Other long term (current) drug therapy: Secondary | ICD-10-CM | POA: Diagnosis not present

## 2022-04-25 DIAGNOSIS — L400 Psoriasis vulgaris: Secondary | ICD-10-CM | POA: Diagnosis not present

## 2022-05-17 ENCOUNTER — Telehealth: Payer: Self-pay

## 2022-05-17 NOTE — Telephone Encounter (Signed)
Pt calling to see if Brenda Walsh rxs weight loss injections; she is very interested.  [phone]

## 2022-05-19 NOTE — Telephone Encounter (Signed)
I contacted the patient via phone. I left generic message for the patient to call back to be scheduled. The patient would need to see Deneise Lever for weight loss consult.

## 2022-06-09 NOTE — Telephone Encounter (Signed)
I contacted the patient via phone. She is scheduled for 5/30 with Doreene Burke at 3:15 pm for weight loss consult.

## 2022-07-04 ENCOUNTER — Telehealth: Payer: BC Managed Care – PPO | Admitting: Nurse Practitioner

## 2022-07-04 DIAGNOSIS — J020 Streptococcal pharyngitis: Secondary | ICD-10-CM | POA: Diagnosis not present

## 2022-07-04 MED ORDER — IBUPROFEN 600 MG PO TABS: 600.0000 mg | ORAL_TABLET | Freq: Three times a day (TID) | ORAL | 0 refills | Status: AC | PRN

## 2022-07-04 MED ORDER — AMOXICILLIN 500 MG PO CAPS: 500.0000 mg | ORAL_CAPSULE | Freq: Two times a day (BID) | ORAL | 0 refills | Status: AC

## 2022-07-04 NOTE — Progress Notes (Signed)
E-Visit for Sore Throat - Strep Symptoms  We are sorry that you are not feeling well.  Here is how we plan to help!  Based on what you have shared with me it is likely that you have strep pharyngitis.  Strep pharyngitis is inflammation and infection in the back of the throat.  This is an infection cause by bacteria and is treated with antibiotics.  I have prescribed Amoxicillin 500 mg twice a day for 10 days.   Meds ordered this encounter  Medications   amoxicillin (AMOXIL) 500 MG capsule    Sig: Take 1 capsule (500 mg total) by mouth 2 (two) times daily for 10 days.    Dispense:  20 capsule    Refill:  0   ibuprofen (ADVIL) 600 MG tablet    Sig: Take 1 tablet (600 mg total) by mouth every 8 (eight) hours as needed.    Dispense:  30 tablet    Refill:  0   For throat pain, we recommend over the counter oral pain relief medications such as acetaminophen or aspirin, or anti-inflammatory medications such as ibuprofen or naproxen sodium. Topical treatments such as oral throat lozenges or sprays may be used as needed. Strep infections are not as easily transmitted as other respiratory infections, however we still recommend that you avoid close contact with loved ones, especially the very young and elderly.  Remember to wash your hands thoroughly throughout the day as this is the number one way to prevent the spread of infection and wipe down door knobs and counters with disinfectant.   Home Care: Only take medications as instructed by your medical team. Complete the entire course of an antibiotic. Do not take these medications with alcohol. A steam or ultrasonic humidifier can help congestion.  You can place a towel over your head and breathe in the steam from hot water coming from a faucet. Avoid close contacts especially the very young and the elderly. Cover your mouth when you cough or sneeze. Always remember to wash your hands.  Get Help Right Away If: You develop worsening fever or sinus  pain. You develop a severe head ache or visual changes. Your symptoms persist after you have completed your treatment plan.  Make sure you Understand these instructions. Will watch your condition. Will get help right away if you are not doing well or get worse.   Thank you for choosing an e-visit.  Your e-visit answers were reviewed by a board certified advanced clinical practitioner to complete your personal care plan. Depending upon the condition, your plan could have included both over the counter or prescription medications.  Please review your pharmacy choice. Make sure the pharmacy is open so you can pick up prescription now. If there is a problem, you may contact your provider through [employer] and have the prescription routed to another pharmacy.  Your safety is important to [place]. If you have drug allergies check your prescription carefully.   For the next 24 hours you can use MyChart to ask questions about today's visit, request a non-urgent call back, or ask for a work or school excuse. You will get an email in the next two days asking about your experience. I hope that your e-visit has been valuable and will speed your recovery.   I spent approximately 5 minutes reviewing the patient's history, current symptoms and coordinating their care today.

## 2022-07-06 ENCOUNTER — Ambulatory Visit
Admission: RE | Admit: 2022-07-06 | Discharge: 2022-07-06 | Disposition: A | Discharge status: Home / Self Care | Payer: BC Managed Care – PPO | Source: Ambulatory Visit | Attending: Obstetrics | Admitting: Obstetrics

## 2022-07-06 DIAGNOSIS — Z1231 Encounter for screening mammogram for malignant neoplasm of breast: Secondary | ICD-10-CM | POA: Diagnosis not present

## 2022-07-14 ENCOUNTER — Ambulatory Visit: Payer: BC Managed Care – PPO | Admitting: Certified Nurse Midwife

## 2022-07-15 ENCOUNTER — Telehealth: Payer: Self-pay

## 2022-07-15 NOTE — Telephone Encounter (Signed)
[id number] 

## 2022-07-15 NOTE — Telephone Encounter (Signed)
-----   Message from [place], [place] sent at 07/14/2022 11:49 AM EDT ----- I would still reach out to her , let her know that unless her BMI is 30 or more , I am not able to prescribe medication for her.   Thanks  Pattricia Boss  ----- Message ----- From: Fonda Kinder, CMA Sent: 07/13/2022   2:01 PM EDT To: Doreene Burke, CNM  It looks like she moved appt to 6/25 ----- Message ----- From: Doreene Burke, CNM Sent: 07/08/2022   1:38 PM EDT To: Fonda Kinder, CMA  This pt is on my schedule for weight loss . Her BMI at her last visit 27.64 ( jan 2024), She does not meet the BMI requirement for me to prescribe medications. Please let her know.   I do not want to waste her time.   Thanks,  Pattricia Boss

## 2022-07-19 NOTE — Telephone Encounter (Signed)
Patient has been advised and appt scheduled. KW

## 2022-08-09 ENCOUNTER — Ambulatory Visit: Payer: BC Managed Care – PPO | Admitting: Certified Nurse Midwife

## 2022-11-16 DIAGNOSIS — Z111 Encounter for screening for respiratory tuberculosis: Secondary | ICD-10-CM | POA: Diagnosis not present

## 2022-11-16 DIAGNOSIS — L400 Psoriasis vulgaris: Secondary | ICD-10-CM | POA: Diagnosis not present

## 2022-11-16 DIAGNOSIS — Z79899 Other long term (current) drug therapy: Secondary | ICD-10-CM | POA: Diagnosis not present

## 2022-11-30 DIAGNOSIS — Z79899 Other long term (current) drug therapy: Secondary | ICD-10-CM | POA: Diagnosis not present

## 2023-04-15 ENCOUNTER — Other Ambulatory Visit: Payer: Self-pay | Admitting: Obstetrics and Gynecology

## 2023-05-15 DIAGNOSIS — Z130 Encounter for screening for diseases of the blood and blood-forming organs and certain disorders involving the immune mechanism: Secondary | ICD-10-CM | POA: Diagnosis not present

## 2023-05-15 DIAGNOSIS — Z1331 Encounter for screening for depression: Secondary | ICD-10-CM | POA: Diagnosis not present

## 2023-05-15 DIAGNOSIS — Z124 Encounter for screening for malignant neoplasm of cervix: Secondary | ICD-10-CM | POA: Diagnosis not present

## 2023-05-15 DIAGNOSIS — Z1321 Encounter for screening for nutritional disorder: Secondary | ICD-10-CM | POA: Diagnosis not present

## 2023-05-15 DIAGNOSIS — E559 Vitamin D deficiency, unspecified: Secondary | ICD-10-CM | POA: Diagnosis not present

## 2023-05-15 DIAGNOSIS — Z01411 Encounter for gynecological examination (general) (routine) with abnormal findings: Secondary | ICD-10-CM | POA: Diagnosis not present

## 2023-05-15 DIAGNOSIS — Z131 Encounter for screening for diabetes mellitus: Secondary | ICD-10-CM | POA: Diagnosis not present

## 2023-05-15 DIAGNOSIS — Z13220 Encounter for screening for lipoid disorders: Secondary | ICD-10-CM | POA: Diagnosis not present

## 2023-05-16 ENCOUNTER — Other Ambulatory Visit: Payer: Self-pay | Admitting: Obstetrics and Gynecology

## 2023-05-16 DIAGNOSIS — Z1231 Encounter for screening mammogram for malignant neoplasm of breast: Secondary | ICD-10-CM

## 2023-05-22 DIAGNOSIS — L400 Psoriasis vulgaris: Secondary | ICD-10-CM | POA: Diagnosis not present

## 2023-05-22 DIAGNOSIS — Z79899 Other long term (current) drug therapy: Secondary | ICD-10-CM | POA: Diagnosis not present

## 2023-05-24 DIAGNOSIS — E538 Deficiency of other specified B group vitamins: Secondary | ICD-10-CM | POA: Diagnosis not present

## 2023-06-26 DIAGNOSIS — E538 Deficiency of other specified B group vitamins: Secondary | ICD-10-CM | POA: Diagnosis not present

## 2023-07-12 ENCOUNTER — Ambulatory Visit
Admission: RE | Admit: 2023-07-12 | Discharge: 2023-07-12 | Disposition: A | Discharge status: Home / Self Care | Source: Ambulatory Visit | Attending: Obstetrics and Gynecology | Admitting: Obstetrics and Gynecology

## 2023-07-12 DIAGNOSIS — Z1231 Encounter for screening mammogram for malignant neoplasm of breast: Secondary | ICD-10-CM | POA: Insufficient documentation

## 2023-10-02 DIAGNOSIS — E663 Overweight: Secondary | ICD-10-CM | POA: Diagnosis not present

## 2023-10-02 DIAGNOSIS — K219 Gastro-esophageal reflux disease without esophagitis: Secondary | ICD-10-CM | POA: Diagnosis not present

## 2023-10-02 DIAGNOSIS — R1310 Dysphagia, unspecified: Secondary | ICD-10-CM | POA: Diagnosis not present

## 2023-10-02 DIAGNOSIS — E538 Deficiency of other specified B group vitamins: Secondary | ICD-10-CM | POA: Diagnosis not present

## 2023-11-20 DIAGNOSIS — Z79899 Other long term (current) drug therapy: Secondary | ICD-10-CM | POA: Diagnosis not present

## 2023-11-20 DIAGNOSIS — L400 Psoriasis vulgaris: Secondary | ICD-10-CM | POA: Diagnosis not present

## 2024-02-29 LAB — COLOGUARD: COLOGUARD: NEGATIVE

## 2024-04-12 ENCOUNTER — Ambulatory Visit: Admit: 2024-04-12 | Admitting: Gastroenterology

## 2024-04-22 IMAGING — MG MM DIGITAL DIAGNOSTIC UNILAT*L* W/ TOMO W/ CAD
8 series · 8 of 24 positions shown · non-contrast
Comparison: Previous exam(s).

CLINICAL DATA: Callback for LEFT breast asymmetry seen on CC view
only

EXAM:
DIGITAL DIAGNOSTIC UNILATERAL LEFT MAMMOGRAM WITH TOMOSYNTHESIS AND
CAD
TECHNIQUE: Left digital diagnostic mammography and breast tomosynthesis was
performed. The images were evaluated with computer-aided detection.

[L ML synth-2D]
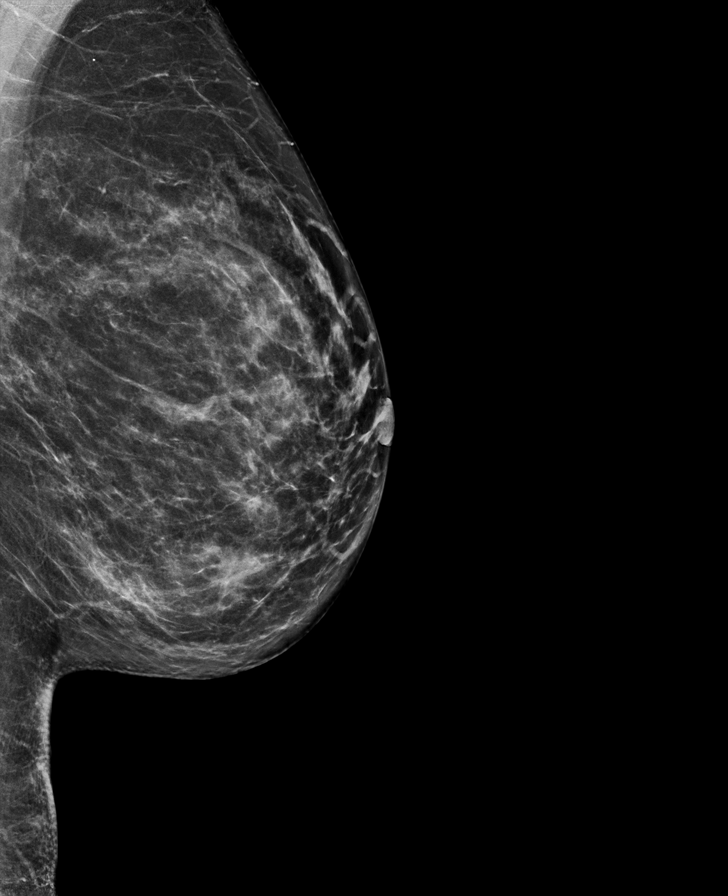

[L CC synth-2D (1 of 3)]
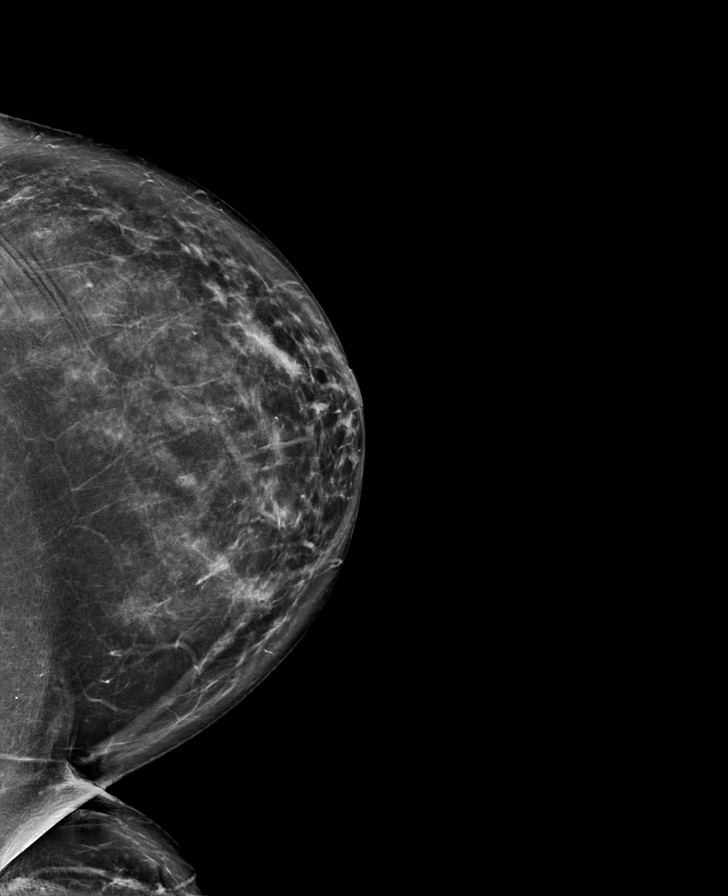

[L CC synth-2D (2 of 3)]
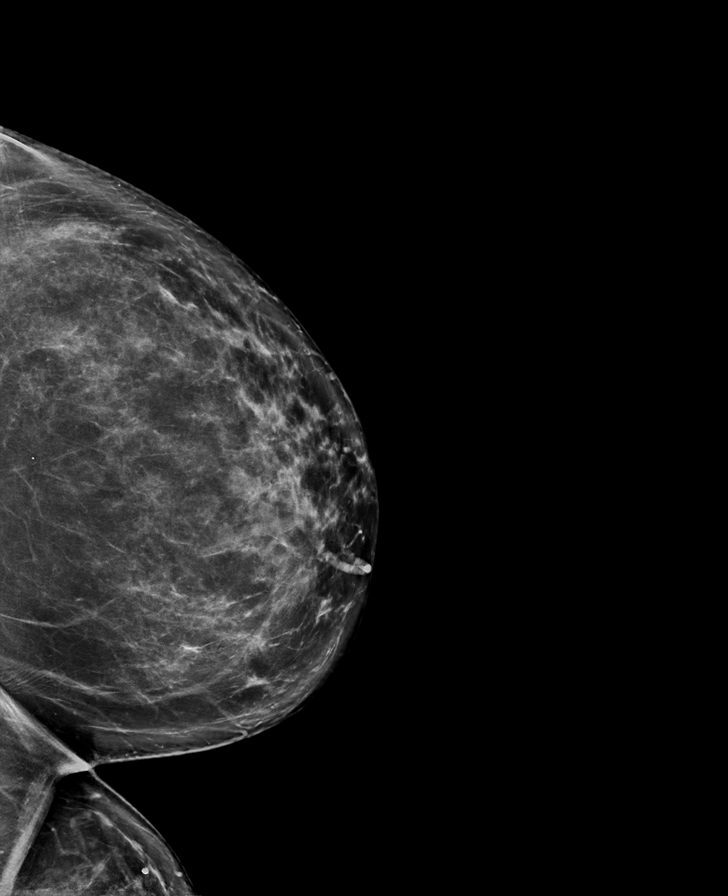

[L CC synth-2D (3 of 3)]
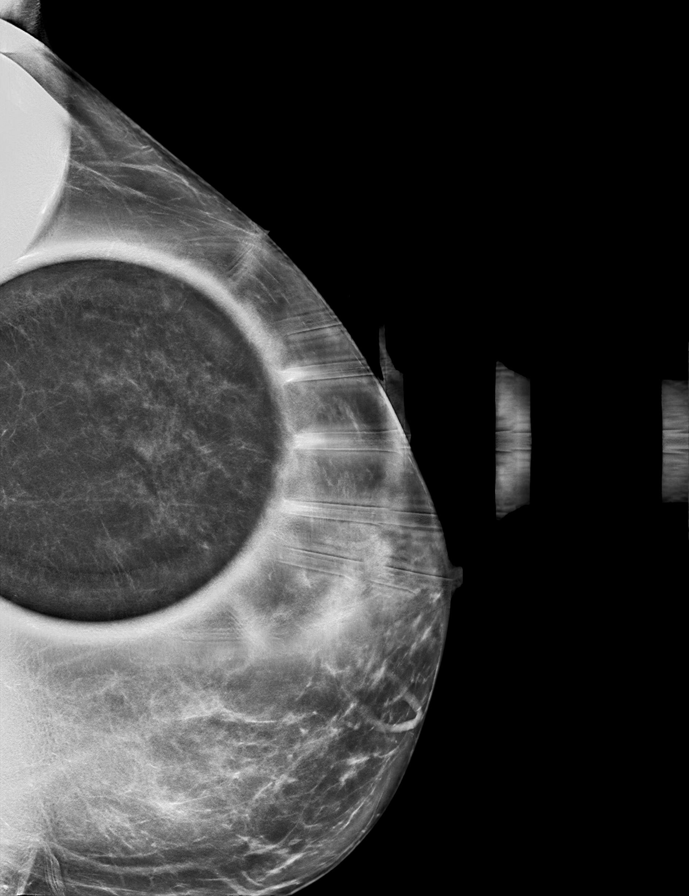

[L CC tomo (1 of 3) · tomo slice 44/87.0]
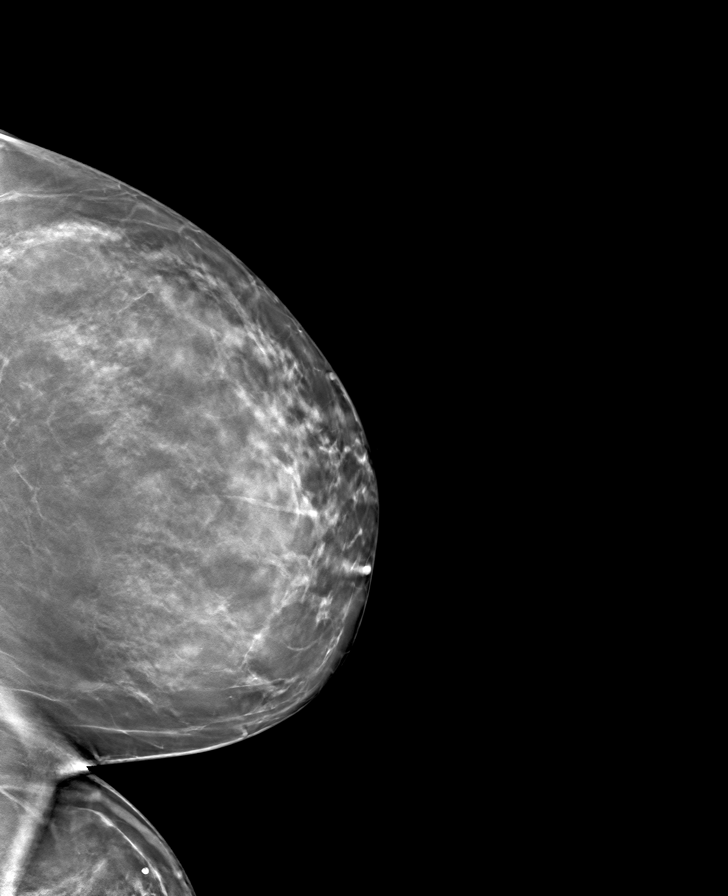

[L ML tomo · tomo slice 39/78.0]
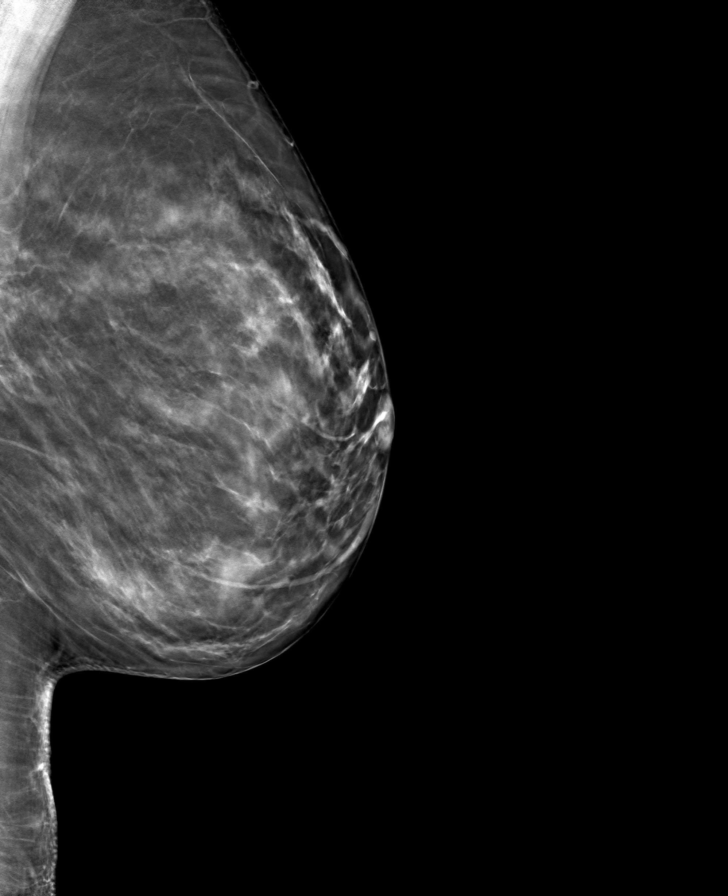

[L CC tomo (2 of 3) · tomo slice 40/79.0]
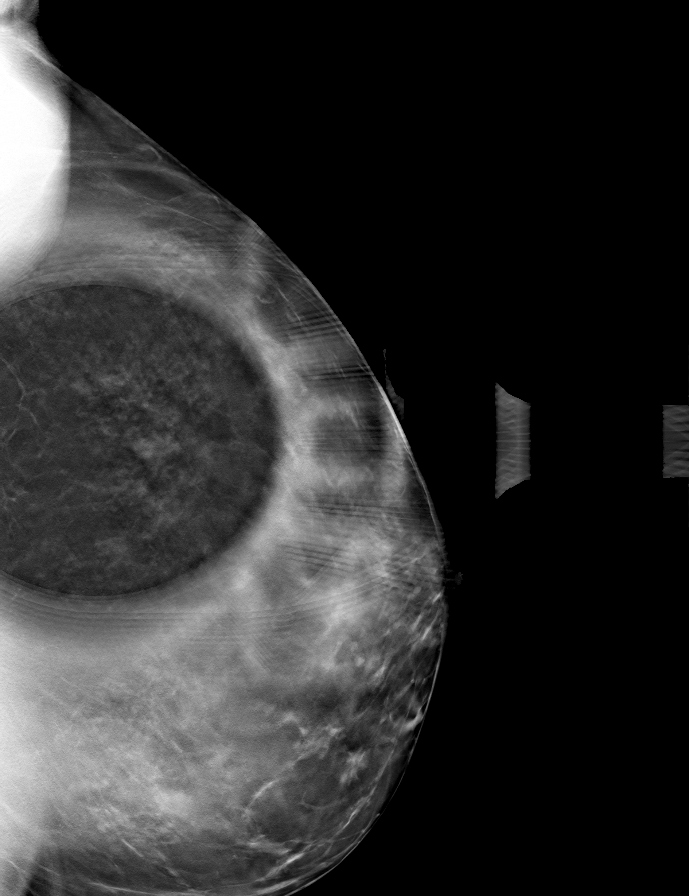

[L CC tomo (3 of 3) · tomo slice 43/85.0]
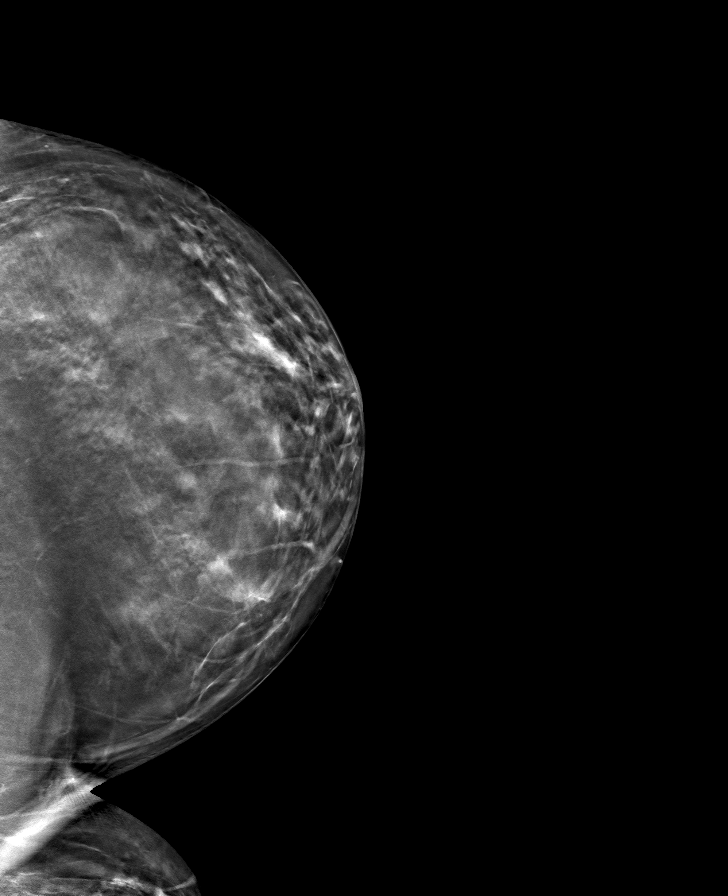

[8 of 24 positions shown; findings below may reference images not displayed]

ACR Breast Density Category c: The breast tissue is heterogeneously
dense, which may obscure small masses.
FINDINGS: The previously described finding does not persist with additional
views, consistent with superimposed fibroglandular tissue.
Fibroglandular tissue assumes a configuration stable in comparison
to remote prior mammograms. No suspicious mass, microcalcification,
or other finding is identified.
IMPRESSION: No mammographic evidence of malignancy.

RECOMMENDATION:
Screening mammogram in one year.(Code:E9-G-9SP)

I have discussed the findings and recommendations with the patient.
If applicable, a reminder letter will be sent to the patient
regarding the next appointment.

BI-RADS CATEGORY  1: Negative.
